# Patient Record
Sex: Male | Born: 1990 | Race: White | Hispanic: No | Marital: Single | Smoking: Former smoker
Health system: Southern US, Community
[De-identification: ages and names within clinical notes are randomized; demographics above are authoritative.]

## PROBLEM LIST (undated history)

## (undated) DIAGNOSIS — E119 Type 2 diabetes mellitus without complications: Secondary | ICD-10-CM

## (undated) DIAGNOSIS — K219 Gastro-esophageal reflux disease without esophagitis: Secondary | ICD-10-CM

## (undated) DIAGNOSIS — R569 Unspecified convulsions: Secondary | ICD-10-CM

---
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
## 2009-06-02 ENCOUNTER — Emergency Department: Payer: Self-pay | Admitting: Emergency Medicine

## 2009-06-10 ENCOUNTER — Emergency Department: Payer: Self-pay | Admitting: Emergency Medicine

## 2009-06-20 ENCOUNTER — Emergency Department: Payer: Self-pay | Admitting: Emergency Medicine

## 2009-08-04 ENCOUNTER — Inpatient Hospital Stay: Payer: Self-pay | Admitting: Internal Medicine

## 2011-11-13 ENCOUNTER — Ambulatory Visit: Payer: Self-pay

## 2011-11-30 ENCOUNTER — Ambulatory Visit: Payer: Self-pay | Admitting: Internal Medicine

## 2013-02-25 ENCOUNTER — Emergency Department: Payer: Self-pay | Admitting: Emergency Medicine

## 2013-02-25 LAB — LIPASE, BLOOD: Lipase: 68 U/L — ABNORMAL LOW (ref 73–393)

## 2013-02-25 LAB — COMPREHENSIVE METABOLIC PANEL
Albumin: 4.7 g/dL (ref 3.4–5.0)
BUN: 22 mg/dL — ABNORMAL HIGH (ref 7–18)
Bilirubin,Total: 0.7 mg/dL (ref 0.2–1.0)
Chloride: 110 mmol/L — ABNORMAL HIGH (ref 98–107)
Creatinine: 0.9 mg/dL (ref 0.60–1.30)
EGFR (African American): >60
EGFR (Non-African Amer.): >60
Glucose: 130 mg/dL — ABNORMAL HIGH (ref 65–99)
Potassium: 4.1 mmol/L (ref 3.5–5.1)
Sodium: 143 mmol/L (ref 136–145)
Total Protein: 8.2 g/dL (ref 6.4–8.2)

## 2013-02-25 LAB — CBC WITH DIFFERENTIAL/PLATELET
Basophil #: 0 10*3/uL (ref 0.0–0.1)
Basophil %: 0.3 %
Eosinophil #: 0.1 10*3/uL (ref 0.0–0.7)
Eosinophil %: 0.5 %
HCT: 49.4 % (ref 40.0–52.0)
HGB: 16.4 g/dL (ref 13.0–18.0)
Lymphocyte %: 9.5 %
MCHC: 33.1 g/dL (ref 32.0–36.0)
MCV: 82 fL (ref 80–100)
Monocyte #: 0.5 x10 3/mm (ref 0.2–1.0)
Monocyte %: 3.4 %
Neutrophil #: 13.2 10*3/uL — ABNORMAL HIGH (ref 1.4–6.5)
Neutrophil %: 86.3 %
RDW: 14.7 % — ABNORMAL HIGH (ref 11.5–14.5)
WBC: 15.3 10*3/uL — ABNORMAL HIGH (ref 3.8–10.6)

## 2013-02-25 LAB — URINALYSIS, COMPLETE
Bilirubin,UR: NEGATIVE
Blood: NEGATIVE
Glucose,UR: 150 mg/dL (ref 0–75)
Leukocyte Esterase: NEGATIVE
RBC,UR: <1 /HPF (ref 0–5)
Squamous Epithelial: NONE SEEN
WBC UR: 1 /HPF (ref 0–5)

## 2014-11-10 ENCOUNTER — Emergency Department: Payer: Self-pay | Admitting: Emergency Medicine

## 2014-11-10 LAB — URINALYSIS, COMPLETE
BILIRUBIN, UR: NEGATIVE
Bacteria: NONE SEEN
Glucose,UR: NEGATIVE mg/dL (ref 0–75)
Hyaline Cast: 13
Ketone: NEGATIVE
LEUKOCYTE ESTERASE: NEGATIVE
Nitrite: NEGATIVE
PH: 5 (ref 4.5–8.0)
Protein: NEGATIVE
RBC,UR: NONE SEEN /HPF (ref 0–5)
SQUAMOUS EPITHELIAL: NONE SEEN
Specific Gravity: 1.013 (ref 1.003–1.030)
WBC UR: NONE SEEN /HPF (ref 0–5)

## 2014-11-10 LAB — DRUG SCREEN, URINE
AMPHETAMINES, UR SCREEN: NEGATIVE (ref ?–1000)
BENZODIAZEPINE, UR SCRN: NEGATIVE (ref ?–200)
Barbiturates, Ur Screen: NEGATIVE (ref ?–200)
Cannabinoid 50 Ng, Ur ~~LOC~~: NEGATIVE (ref ?–50)
Cocaine Metabolite,Ur ~~LOC~~: NEGATIVE (ref ?–300)
MDMA (Ecstasy)Ur Screen: NEGATIVE (ref ?–500)
Methadone, Ur Screen: NEGATIVE (ref ?–300)
Opiate, Ur Screen: POSITIVE (ref ?–300)
Phencyclidine (PCP) Ur S: NEGATIVE (ref ?–25)
Tricyclic, Ur Screen: NEGATIVE (ref ?–1000)

## 2014-11-10 LAB — COMPREHENSIVE METABOLIC PANEL
ALBUMIN: 4.1 g/dL (ref 3.4–5.0)
ANION GAP: 12 (ref 7–16)
Alkaline Phosphatase: 104 U/L
BUN: 18 mg/dL (ref 7–18)
Bilirubin,Total: 0.4 mg/dL (ref 0.2–1.0)
CHLORIDE: 103 mmol/L (ref 98–107)
CO2: 23 mmol/L (ref 21–32)
CREATININE: 1.14 mg/dL (ref 0.60–1.30)
Calcium, Total: 8.4 mg/dL — ABNORMAL LOW (ref 8.5–10.1)
EGFR (Non-African Amer.): >60
Glucose: 92 mg/dL (ref 65–99)
OSMOLALITY: 277 (ref 275–301)
Potassium: 3.8 mmol/L (ref 3.5–5.1)
SGOT(AST): 22 U/L (ref 15–37)
SGPT (ALT): 34 U/L
Sodium: 138 mmol/L (ref 136–145)
TOTAL PROTEIN: 7.5 g/dL (ref 6.4–8.2)

## 2014-11-10 LAB — CBC
HCT: 48.4 % (ref 40.0–52.0)
HGB: 15.6 g/dL (ref 13.0–18.0)
MCH: 27.4 pg (ref 26.0–34.0)
MCHC: 32.3 g/dL (ref 32.0–36.0)
MCV: 85 fL (ref 80–100)
Platelet: 186 10*3/uL (ref 150–440)
RBC: 5.69 10*6/uL (ref 4.40–5.90)
RDW: 14.1 % (ref 11.5–14.5)
WBC: 13.2 10*3/uL — ABNORMAL HIGH (ref 3.8–10.6)

## 2014-11-10 LAB — ETHANOL

## 2019-07-20 ENCOUNTER — Emergency Department: Payer: BC Managed Care – PPO

## 2019-07-20 ENCOUNTER — Encounter: Payer: Self-pay | Admitting: *Deleted

## 2019-07-20 ENCOUNTER — Emergency Department
Admission: EM | Admit: 2019-07-20 | Discharge: 2019-07-20 | Disposition: A | Discharge status: Other Acute Inpatient Hospital | Payer: BC Managed Care – PPO | Attending: Emergency Medicine | Admitting: Emergency Medicine

## 2019-07-20 ENCOUNTER — Other Ambulatory Visit: Payer: Self-pay

## 2019-07-20 DIAGNOSIS — S06321A Contusion and laceration of left cerebrum with loss of consciousness of 30 minutes or less, initial encounter: Secondary | ICD-10-CM

## 2019-07-20 DIAGNOSIS — R569 Unspecified convulsions: Secondary | ICD-10-CM | POA: Insufficient documentation

## 2019-07-20 DIAGNOSIS — F0781 Postconcussional syndrome: Secondary | ICD-10-CM

## 2019-07-20 DIAGNOSIS — E11649 Type 2 diabetes mellitus with hypoglycemia without coma: Secondary | ICD-10-CM | POA: Diagnosis not present

## 2019-07-20 DIAGNOSIS — W19XXXA Unspecified fall, initial encounter: Secondary | ICD-10-CM | POA: Diagnosis not present

## 2019-07-20 DIAGNOSIS — Y999 Unspecified external cause status: Secondary | ICD-10-CM | POA: Diagnosis not present

## 2019-07-20 DIAGNOSIS — S098XXA Other specified injuries of head, initial encounter: Secondary | ICD-10-CM | POA: Insufficient documentation

## 2019-07-20 DIAGNOSIS — Y9389 Activity, other specified: Secondary | ICD-10-CM | POA: Insufficient documentation

## 2019-07-20 DIAGNOSIS — S0990XA Unspecified injury of head, initial encounter: Secondary | ICD-10-CM | POA: Diagnosis present

## 2019-07-20 DIAGNOSIS — S065X9A Traumatic subdural hemorrhage with loss of consciousness of unspecified duration, initial encounter: Secondary | ICD-10-CM

## 2019-07-20 DIAGNOSIS — Y9289 Other specified places as the place of occurrence of the external cause: Secondary | ICD-10-CM | POA: Diagnosis not present

## 2019-07-20 DIAGNOSIS — E162 Hypoglycemia, unspecified: Secondary | ICD-10-CM

## 2019-07-20 DIAGNOSIS — S060X1A Concussion with loss of consciousness of 30 minutes or less, initial encounter: Secondary | ICD-10-CM

## 2019-07-20 DIAGNOSIS — S060X0A Concussion without loss of consciousness, initial encounter: Secondary | ICD-10-CM | POA: Diagnosis not present

## 2019-07-20 DIAGNOSIS — S065XAA Traumatic subdural hemorrhage with loss of consciousness status unknown, initial encounter: Secondary | ICD-10-CM

## 2019-07-20 DIAGNOSIS — S06351A Traumatic hemorrhage of left cerebrum with loss of consciousness of 30 minutes or less, initial encounter: Secondary | ICD-10-CM

## 2019-07-20 HISTORY — DX: Type 2 diabetes mellitus without complications: E11.9

## 2019-07-20 HISTORY — DX: Unspecified convulsions: R56.9

## 2019-07-20 LAB — CBC
HCT: 45.8 % (ref 39.0–52.0)
Hemoglobin: 15.2 g/dL (ref 13.0–17.0)
MCH: 28 pg (ref 26.0–34.0)
MCHC: 33.2 g/dL (ref 30.0–36.0)
MCV: 84.3 fL (ref 80.0–100.0)
Platelets: 213 10*3/uL (ref 150–400)
RBC: 5.43 MIL/uL (ref 4.22–5.81)
RDW: 13.9 % (ref 11.5–15.5)
WBC: 9.8 10*3/uL (ref 4.0–10.5)
nRBC: 0 % (ref 0.0–0.2)

## 2019-07-20 LAB — GLUCOSE, CAPILLARY
Glucose-Capillary: 92 mg/dL (ref 70–99)
Glucose-Capillary: 70 mg/dL (ref 70–99)
Glucose-Capillary: 83 mg/dL (ref 70–99)
Glucose-Capillary: 238 mg/dL — ABNORMAL HIGH (ref 70–99)

## 2019-07-20 LAB — COMPREHENSIVE METABOLIC PANEL
ALT: 19 U/L (ref 0–44)
AST: 32 U/L (ref 15–41)
Albumin: 4.6 g/dL (ref 3.5–5.0)
Alkaline Phosphatase: 82 U/L (ref 38–126)
Anion gap: 14 (ref 5–15)
BUN: 19 mg/dL (ref 6–20)
CO2: 19 mmol/L — ABNORMAL LOW (ref 22–32)
Calcium: 8.7 mg/dL — ABNORMAL LOW (ref 8.9–10.3)
Chloride: 108 mmol/L (ref 98–111)
Creatinine, Ser: 1.02 mg/dL (ref 0.61–1.24)
GFR calc Af Amer: >60 mL/min (ref >60)
GFR calc non Af Amer: >60 mL/min (ref >60)
Glucose, Bld: 104 mg/dL — ABNORMAL HIGH (ref 70–99)
Potassium: 3.3 mmol/L — ABNORMAL LOW (ref 3.5–5.1)
Sodium: 141 mmol/L (ref 135–145)
Total Bilirubin: 0.8 mg/dL (ref 0.3–1.2)
Total Protein: 7.4 g/dL (ref 6.5–8.1)

## 2019-07-20 MED ORDER — SODIUM CHLORIDE 0.9% FLUSH
3.0000 mL | Freq: Once | INTRAVENOUS | Status: AC
  Administered 2019-07-20: 3 mL via INTRAVENOUS

## 2019-07-20 MED ORDER — KETOROLAC TROMETHAMINE 30 MG/ML IJ SOLN
30.0000 mg | Freq: Once | INTRAMUSCULAR | Status: AC
  Administered 2019-07-20: 30 mg via INTRAVENOUS
  Filled 2019-07-20: qty 1

## 2019-07-20 MED ORDER — ONDANSETRON HCL 4 MG/2ML IJ SOLN
4.0000 mg | Freq: Once | INTRAMUSCULAR | Status: AC
  Administered 2019-07-20: 4 mg via INTRAVENOUS
  Filled 2019-07-20: qty 2

## 2019-07-20 MED ORDER — DEXTROSE IN LACTATED RINGERS 5 % IV SOLN
INTRAVENOUS | Status: DC
  Administered 2019-07-20: 13:00:00 via INTRAVENOUS

## 2019-07-20 MED ORDER — ONDANSETRON 4 MG PO TBDP: 4.0000 mg | ORAL_TABLET | Freq: Three times a day (TID) | ORAL | 0 refills | Status: DC | PRN

## 2019-07-20 MED ORDER — METOCLOPRAMIDE HCL 5 MG/ML IJ SOLN
10.0000 mg | Freq: Once | INTRAMUSCULAR | Status: AC
  Administered 2019-07-20: 10 mg via INTRAVENOUS
  Filled 2019-07-20: qty 2

## 2019-07-20 MED ORDER — ACETAMINOPHEN 500 MG PO TABS
1000.0000 mg | ORAL_TABLET | Freq: Once | ORAL | Status: AC
  Administered 2019-07-20: 11:00:00 1000 mg via ORAL
  Filled 2019-07-20: qty 2

## 2019-07-20 MED ORDER — MORPHINE SULFATE (PF) 4 MG/ML IV SOLN
4.0000 mg | Freq: Once | INTRAVENOUS | Status: AC
  Administered 2019-07-20: 4 mg via INTRAVENOUS
  Filled 2019-07-20: qty 1

## 2019-07-20 MED ORDER — LEVETIRACETAM IN NACL 1000 MG/100ML IV SOLN
1000.0000 mg | Freq: Once | INTRAVENOUS | Status: AC
  Administered 2019-07-20: 18:00:00 1000 mg via INTRAVENOUS
  Filled 2019-07-20: qty 100

## 2019-07-20 MED ORDER — FENTANYL CITRATE (PF) 100 MCG/2ML IJ SOLN: 100.0000 ug | INTRAMUSCULAR | Status: DC | PRN

## 2019-07-20 MED ORDER — GADOBUTROL 1 MMOL/ML IV SOLN
7.0000 mL | Freq: Once | INTRAVENOUS | Status: AC | PRN
  Administered 2019-07-20: 17:00:00 7 mL via INTRAVENOUS

## 2019-07-20 NOTE — ED Notes (Signed)
Pt still in mri 

## 2019-07-20 NOTE — ED Provider Notes (Signed)
[facility] Emergency Department Provider Note       Time seen: ----------------------------------------- 10:31 AM on 07/20/2019 -----------------------------------------   I have reviewed the triage vital signs and the nursing notes.  HISTORY   Chief Complaint Seizures and Head Injury    HPI Lucas Shaw is a 28 y.o. male with a history of diabetes and seizures who presents to the ED for a fall that occurred at work.  Patient fell back and hit his head.  Staff noted seizure-like activity but not sure if this came before or after the fall.  Blood sugar was 43 and then was elevated to 103 on arrival to the ER.  Patient states he has not eaten this morning.  Zofran was given by EMS.  Past Medical History:  Diagnosis Date  . Diabetes mellitus without complication (HCC)   . Seizures (HCC)     There are no active problems to display for this patient.   History reviewed. No pertinent surgical history.  Allergies Patient has no known allergies.  Social History Social History   Tobacco Use  . Smoking status: Never Smoker  . Smokeless tobacco: Never Used  Substance Use Topics  . Alcohol use: Yes    Comment: occasionally  . Drug use: Never    Review of Systems Constitutional: Negative for fever. Cardiovascular: Negative for chest pain. Respiratory: Negative for shortness of breath. Gastrointestinal: Negative for abdominal pain, positive for vomiting Musculoskeletal: Negative for back pain. Skin: Negative for rash. Neurological: Positive for headache, possible seizure-like activity  All systems negative/normal/unremarkable except as stated in the HPI  ____________________________________________   PHYSICAL EXAM:  VITAL SIGNS: ED Triage Vitals  Enc Vitals Group     BP --      Pulse Rate 07/20/19 1004 (!) 103     Resp 07/20/19 1004 12     Temp 07/20/19 1004 98.4 F (36.9 C)     Temp Source 07/20/19 1004 Oral     SpO2 07/20/19  1004 98 %     Weight 07/20/19 1005 170 lb (77.1 kg)     Height 07/20/19 1005 5\' 8"  (1.727 m)     Head Circumference --      Peak Flow --      Pain Score 07/20/19 1005 10     Pain Loc --      Pain Edu? --      Excl. in GC? --     Constitutional: Alert and oriented. Well appearing and in no distress. Eyes: Conjunctivae are normal. Normal extraocular movements. ENT      Head: Normocephalic, severe left parieto-occipital scalp tenderness      Nose: No congestion/rhinnorhea.      Mouth/Throat: Mucous membranes are moist.      Neck: No stridor. Cardiovascular: Normal rate, regular rhythm. No murmurs, rubs, or gallops. Respiratory: Normal respiratory effort without tachypnea nor retractions. Breath sounds are clear and equal bilaterally. No wheezes/rales/rhonchi. Gastrointestinal: Soft and nontender. Normal bowel sounds Musculoskeletal: Nontender with normal range of motion in extremities. No lower extremity tenderness nor edema. Neurologic:  Normal speech and language. No gross focal neurologic deficits are appreciated.  Skin:  Skin is warm, dry and intact. No rash noted. Psychiatric: Mood and affect are normal. Speech and behavior are normal.  ____________________________________________  ED COURSE:  As part of my medical decision making, I reviewed the following data within the electronic MEDICAL RECORD NUMBER History obtained from family if available, nursing notes, old chart and ekg, as well as  notes from prior ED visits. Patient presented for a fall with head injury likely due to hypoglycemia, we will assess with labs and imaging as indicated at this time.   Procedures  [name] was evaluated in Emergency Department on 07/20/2019 for the symptoms described in the history of present illness. He was evaluated in the context of the global COVID-19 pandemic, which necessitated consideration that the patient might be at risk for infection with the SARS-CoV-2 virus that causes COVID-19.  Institutional protocols and algorithms that pertain to the evaluation of patients at risk for COVID-19 are in a state of rapid change based on information released by regulatory bodies including the CDC and federal and state organizations. These policies and algorithms were followed during the patient's care in the ED.  ____________________________________________   LABS (pertinent positives/negatives)  Labs Reviewed  COMPREHENSIVE METABOLIC PANEL - Abnormal; Notable for the following components:      Result Value   Potassium 3.3 (*)    CO2 19 (*)    Glucose, Bld 104 (*)    Calcium 8.7 (*)    All other components within normal limits  GLUCOSE, CAPILLARY - Abnormal; Notable for the following components:   Glucose-Capillary 238 (*)    All other components within normal limits  CBC  GLUCOSE, CAPILLARY  GLUCOSE, CAPILLARY  GLUCOSE, CAPILLARY  CBG MONITORING, ED    RADIOLOGY Images were viewed by me  CT head  IMPRESSION:  1. Small area of hyperattenuation in the inferior aspect of the  right frontal lobe, overlying the right frontal sinus. This may  represent an artifact, as this part of the brain is prone to beam  hardening artifacts, however in the settings of acute trauma, and  given the asymmetric appearance when compared to the left frontal  lobe, small intraparenchymal hemorrhage cannot be excluded.  2. Otherwise no evidence of acute traumatic injury to the head.  ____________________________________________   DIFFERENTIAL DIAGNOSIS   Hypoglycemia, arrhythmia, seizure, dehydration, electrolyte abnormality, subdural, subarachnoid hemorrhage  FINAL ASSESSMENT AND PLAN  Fall, minor head injury, hypoglycemia, possible seizure, concussion   Plan: The patient had presented for a fall that occurred at work likely from hypoglycemia. Patient's labs were overall reassuring although he did have some borderline hypoglycemia for which she was given D5 LR. Patient's imaging revealed  a small area of hyperattenuation which was reviewed by both neurosurgery and neurology and felt to be artifactual.  He has had a bad headache and had some nausea vomiting here which I think is secondary to concussion.  Due to his persistent headache and vomiting we have consulted neurology who recommends MRI.  If MRI is negative he may go home.   Laurence Aly, MD    Note: This note was generated in part or whole with voice recognition software. Voice recognition is usually quite accurate but there are transcription errors that can and very often do occur. I apologize for any typographical errors that were not detected and corrected.     Earleen Newport, MD 07/20/19 [phone]

## 2019-07-20 NOTE — Progress Notes (Signed)
Neurology:  84 y/or male with hx of DM on insulin s/p fall today and seizure like activity in setting of hypoglycemia. Complaining of headache, N/V. No focality on examination.     Neurological Examination   Mental Status: Alert, oriented, thought content appropriate.  Speech fluent without evidence of aphasia.  Able to follow 3 step commands without difficulty. Cranial Nerves: III,IV, VI: ptosis not present, extra-ocular motions intact bilaterally V,VII: smile symmetric, facial light touch sensation normal bilaterally VIII: hearing normal bilaterally IX,X: gag reflex present XI: bilateral shoulder shrug XII: midline tongue extension Motor: Right : Upper extremity   5/5    Left:     Upper extremity   5/5  Lower extremity   5/5     Lower extremity   5/5 Tone and bulk:normal tone throughout; no atrophy noted Sensory: Pinprick and light touch intact throughout, bilaterally Deep Tendon Reflexes: 2+ and symmetric throughout Plantars: Right: downgoing   Left: downgoing Cerebellar: normal finger-to-nose, normal rapid alternating movements and normal heel-to-shin test Gait: not tested    - CTH don't think there is any hemorrhage but likely artifact - Pt is with post concussive symptoms s/p fall - Agree with MRI of brain while in ED to look for any acute process and if negative can be d/c home with supportive therapy - Supportive post concussion therapy includes: hydration, limit screen time, limit bright lights, rest for up to 7-10 days.

## 2019-07-20 NOTE — ED Notes (Signed)
EMTALA reviewed. 

## 2019-07-20 NOTE — ED Notes (Signed)
Pt attempted to sit up and started vomiting immediately. MD made aware.

## 2019-07-20 NOTE — ED Notes (Signed)
Immediately after taking tylenol pt had another episode of vomiting. MD made aware.

## 2019-07-20 NOTE — ED Notes (Signed)
Transfer consent signed by pt  Ems in with pt now.  Family at bedside.

## 2019-07-20 NOTE — ED Notes (Signed)
Resumed care from shannon rn. Pt alert.  Iv fluids infusing.  meds given.  Pt alert   Speech clear.   Family with pt. nsr on monitor.

## 2019-07-20 NOTE — ED Provider Notes (Signed)
Patient received in sign-out from Dr. Jimmye Norman.  Workup and evaluation pending MRI.  MRI does show evidence of closed head injury.  Will give dose of IV Keppra.  Is not on any anticoagulation.  Blood pressure stable.  Patient with previous care at [facility].  Have consulted with the transfer center.  Patient stabilized and appropriate for transfer to tertiary facility.   .Critical Care Performed by: Merlyn Lot, MD Authorized by: Merlyn Lot, MD   Critical care provider statement:    Critical care time (minutes):  30   Critical care time was exclusive of:  Separately billable procedures and treating other patients   Critical care was necessary to treat or prevent imminent or life-threatening deterioration of the following conditions:  Trauma   Critical care was time spent personally by me on the following activities:  Development of treatment plan with patient or surrogate, discussions with consultants, evaluation of patient's response to treatment, examination of patient, obtaining history from patient or surrogate, ordering and performing treatments and interventions, ordering and review of laboratory studies, ordering and review of radiographic studies, pulse oximetry, re-evaluation of patient's condition and review of old charts       Merlyn Lot, MD 07/20/19 1749

## 2019-07-20 NOTE — ED Notes (Signed)
c-collar placed on pt prior to transfer.

## 2019-07-20 NOTE — ED Triage Notes (Signed)
Unsteady gate at work, pt fell back and hit his head. Staff noted a seizure but is unsure if the seizure came befor ethe fall or after. CBG was 43 and elevated to 103 with EMS in route to hospital. Hx of seizures when pt was younger but not medications takenm for seizure.   4mg  Zofran given with EMS.

## 2019-07-20 NOTE — ED Notes (Signed)
Patient transported to MRI 

## 2019-07-20 NOTE — ED Notes (Signed)
Neurologist at bedside to assess patient. Pts mother at bedside as well.

## 2019-07-20 NOTE — ED Notes (Signed)
Patient transported to CT 

## 2019-07-21 DIAGNOSIS — I619 Nontraumatic intracerebral hemorrhage, unspecified: Secondary | ICD-10-CM | POA: Insufficient documentation

## 2020-06-07 ENCOUNTER — Emergency Department: Payer: BC Managed Care – PPO

## 2020-06-07 ENCOUNTER — Other Ambulatory Visit: Payer: Self-pay

## 2020-06-07 ENCOUNTER — Emergency Department
Admission: EM | Admit: 2020-06-07 | Discharge: 2020-06-07 | Disposition: A | Discharge status: Home / Self Care | Payer: BC Managed Care – PPO | Attending: Emergency Medicine | Admitting: Emergency Medicine

## 2020-06-07 ENCOUNTER — Encounter: Payer: Self-pay | Admitting: Emergency Medicine

## 2020-06-07 DIAGNOSIS — E109 Type 1 diabetes mellitus without complications: Secondary | ICD-10-CM | POA: Diagnosis not present

## 2020-06-07 DIAGNOSIS — F458 Other somatoform disorders: Secondary | ICD-10-CM | POA: Insufficient documentation

## 2020-06-07 DIAGNOSIS — Z794 Long term (current) use of insulin: Secondary | ICD-10-CM | POA: Diagnosis not present

## 2020-06-07 DIAGNOSIS — R0989 Other specified symptoms and signs involving the circulatory and respiratory systems: Secondary | ICD-10-CM

## 2020-06-07 MED ORDER — NITROGLYCERIN 0.4 MG SL SUBL
0.4000 mg | SUBLINGUAL_TABLET | Freq: Once | SUBLINGUAL | Status: AC
  Administered 2020-06-07: 0.4 mg via SUBLINGUAL
  Filled 2020-06-07: qty 1

## 2020-06-07 MED ORDER — ACETAMINOPHEN 500 MG PO TABS: 1000.0000 mg | ORAL_TABLET | Freq: Once | ORAL | Status: DC

## 2020-06-07 MED ORDER — LIDOCAINE VISCOUS HCL 2 % MT SOLN
15.0000 mL | Freq: Once | OROMUCOSAL | Status: AC
  Administered 2020-06-07: 15 mL via OROMUCOSAL
  Filled 2020-06-07: qty 15

## 2020-06-07 MED ORDER — ACETAMINOPHEN 160 MG/5ML PO SOLN
1000.0000 mg | Freq: Once | ORAL | Status: AC
  Administered 2020-06-07: 1000 mg via ORAL
  Filled 2020-06-07: qty 40.6

## 2020-06-07 NOTE — Discharge Instructions (Signed)
Please follow up with GI if symptoms become recurrent.   Return to the ER for symptoms that change or worsen if unable to see PCP or the GI specialist.

## 2020-06-07 NOTE — ED Notes (Signed)
See triage note   States he feels like something is stuck in his throat  States he felt like his sugar was low  He drank a [employer] and ate some peanut butter    States he is not sure if some of the peanut butter became stuck  Swallowing well

## 2020-06-07 NOTE — ED Triage Notes (Signed)
Pt states that it hurts when he talk or swallows

## 2020-06-07 NOTE — ED Triage Notes (Signed)
Pt reports woke up this am and drank some Mt Dew and ate some peanut butter because he is a diabetic and now he feels like something is stuck in his throat. Pt able to speak in full sentences.

## 2020-06-07 NOTE — ED Provider Notes (Signed)
[facility] Emergency Department Provider Note  ____________________________________________  Time seen: Approximately 9:22 AM  I have reviewed the triage vital signs and the nursing notes.   HISTORY  Chief Complaint Foreign Body and Emesis    HPI Lucas Shaw is a 29 y.o. male  With a history of type 1 diabetes and seizures who presents to the emergency department for  treatment and evaluation of esophageal foreign body sensation.  Patient states that around 630 this morning his blood sugar was low when he got up and ate some creamy peanut butter and Mt. Dew. He states that he feels that it is still stuck in his esophagus and is having trouble swallowing. He has had similar incidences in the past, but have not lasted this long.   Past Medical History:  Diagnosis Date  . Diabetes mellitus without complication (HCC)   . Seizures (HCC)     There are no problems to display for this patient.   History reviewed. No pertinent surgical history.  Prior to Admission medications   Medication Sig Start Date End Date Taking? Authorizing Provider  fluticasone (FLONASE) 50 MCG/ACT nasal spray Place 1-2 sprays into both nostrils daily. 02/21/19   [provider]  HUMALOG KWIKPEN 100 UNIT/ML KwikPen Inject 5 Units into the skin 3 (three) times daily before meals. 02/25/19   [provider]  insulin aspart (NOVOLOG FLEXPEN) 100 UNIT/ML FlexPen Inject 5 Units into the skin 3 (three) times daily before meals. 02/19/17   [provider]  LANTUS SOLOSTAR 100 UNIT/ML Solostar Pen Inject 25 Units into the skin at bedtime. 06/11/19   [provider]    Allergies Patient has no known allergies.  No family history on file.  Social History Social History   Tobacco Use  . Smoking status: Never Smoker  . Smokeless tobacco: Never Used  Substance Use Topics  . Alcohol use: Yes    Comment: occasionally  . Drug use: Never    Review of  Systems Constitutional: Negative for fever. Eyes: No visual changes. ENT: Positive for foreign body sensation in throat; positive for difficulty swallowing. Respiratory: Denies shortness of breath. Gastrointestinal: Negative for abdominal pain.  No nausea, no vomiting.  No diarrhea.  Negative for frequent heartburn or GERD Genitourinary: Negative for dysuria.  Negative for decrease in need to void. Musculoskeletal: Negative for generalized body aches. Skin: Negative for rash. Neurological: Negative for headaches, negative for focal weakness or numbness.  ____________________________________________   PHYSICAL EXAM:  VITAL SIGNS: ED Triage Vitals  Enc Vitals Group     BP 06/07/20 0812 (!) 163/97     Pulse Rate 06/07/20 0812 94     Resp 06/07/20 0812 22     Temp 06/07/20 0812 98.8 F (37.1 C)     Temp Source 06/07/20 0812 Oral     SpO2 06/07/20 0812 99 %     Weight 06/07/20 0813 169 lb 12.1 oz (77 kg)     Height 06/07/20 0805 5\' 8"  (1.727 m)     Head Circumference --      Peak Flow --      Pain Score 06/07/20 0805 7     Pain Loc --      Pain Edu? --      Excl. in GC? --     Constitutional: Alert and oriented. Well appearing and in no acute distress. Eyes: Conjunctivae are normal.  Head: Atraumatic. Nose: No congestion/rhinnorhea. Mouth/Throat: Mucous membranes are moist.  Oropharynx clear, tonsils 1+  without exudate. Uvula is midline. Ears: Exam deferred. Neck: No stridor. Voice clear Lymphatic: Anterior cervical nodes non-palpable. Cardiovascular: Normal rate, regular rhythm. Good peripheral circulation. Respiratory: Normal respiratory effort. Lungs CTAB. Gastrointestinal: Soft and nontender. Musculoskeletal: FROM of neck, upper and lower extremities. Neurologic:  Normal speech and language. No gross focal neurologic deficits are appreciated. Skin:  Skin is warm, dry and intact. No rash noted Psychiatric: Mood and affect are normal. Speech and behavior are  normal.  ____________________________________________   LABS (all labs ordered are listed, but only abnormal results are displayed)  Labs Reviewed - No data to display ____________________________________________  EKG  Not indiated ____________________________________________  RADIOLOGY  Not indicated. ____________________________________________   PROCEDURES  Procedure(s) performed: None  Critical Care performed: No ____________________________________________   INITIAL IMPRESSION / ASSESSMENT AND PLAN / ED COURSE  29 year old male presents to the emergency department for treatment and evaluation of foreign body sensation. See HPI for further details. Awaiting x-ray result.  X-ray is negative for radiopaque foreign body.  Plan will be to give him some viscous lidocaine and see if that takes away the globus sensation.  Not much improvement after the viscous lidocaine.  He is able to swallow water and maintain secretions but does not feel that he will be able to tolerate any solid foods.  Plan will be to try nitroglycerin.  Significant improvement after sublingual nitroglycerin.  He was also given some Tylenol to help prevent headache.  Patient feels that he is ready for discharge.  He states that he still has a slight foreign body sensation but that it is getting better.  He was advised that he needs to take small bites and chew his food well.  If symptoms become recurrent he is to see GI.  Pertinent labs & imaging results that were available during my care of the patient were reviewed by me and considered in my medical decision making (see chart for details). ____________________________________________  New Prescriptions   No medications on file    FINAL CLINICAL IMPRESSION(S) / ED DIAGNOSES  Final diagnoses:  Globus pharyngeus    If controlled substance prescribed during this visit, 12 month history viewed on the [facility] prior to issuing an initial prescription  for Schedule II or III opiod.   Note:  This document was prepared using Dragon voice recognition software and may include unintentional dictation errors.   Chinita Pester, FNP 06/07/20 1019    Emily Filbert, MD 06/07/20 1051

## 2020-06-10 ENCOUNTER — Telehealth: Payer: Self-pay

## 2020-06-10 MED ORDER — OMEPRAZOLE 40 MG PO CPDR
40.0000 mg | DELAYED_RELEASE_CAPSULE | Freq: Two times a day (BID) | ORAL | 2 refills | Status: DC
Start: 2020-06-10 — End: 2020-09-26

## 2020-06-10 NOTE — Telephone Encounter (Signed)
Called and patient would like medicine sent to [facility]. Sent medication to pharmacy. He made appointment for 08/07/2020

## 2020-06-10 NOTE — Telephone Encounter (Signed)
-----   Message from Toney Reil, MD sent at 06/07/2020  2:28 PM EDT ----- Regarding: Re: dysphagia Please call pt and send in a prescription for prilosec 40mg  BID until seen by Tell him we received referral from ER  RV

## 2020-06-19 NOTE — H&P (Signed)
NAME: Lucas Shaw, Lucas Shaw MEDICAL RECORD CZ:[id number] ACCOUNT [id number] DATE OF BIRTH:06-23-1991 FACILITY: [facility] LOCATION:  PHYSICIAN:Preet Gilles Chiquito, MD  HISTORY AND PHYSICAL  DATE OF ADMISSION:  06/27/2020  CHIEF COMPLAINT:  Right groin swelling and pain.  HISTORY OF PRESENT ILLNESS:  The patient is a 29 year old Caucasian male who has had intermittent right inguinal and scrotal swelling associated with discomfort since 06/28/2020.  More recently, he has had discomfort and increased swelling, particularly  with lifting and straining.  He does perform a lot of lifting at work.  He was found to have a right inguinal hernia on exam and comes in for right inguinal herniorrhaphy.  ALLERGIES:  No drug allergies.  CURRENT MEDICATIONS:  Insulin.  SURGICAL HISTORY:  Negative.  PAST AND CURRENT MEDICAL CONDITIONS:   1.  Concussion due to head trauma 2020 without permanent sequelae. 2.  Insulin-dependent diabetes since 2001.  REVIEW OF SYSTEMS:  The patient denies chest pain, shortness of breath, heart disease, lung disease, hypertension or stroke.  SOCIAL HISTORY:  The patient quit smoking in 2013 with a 5-pack year history.  He consumes alcohol rarely.  FAMILY HISTORY:  Father died at age 69 of a drug overdose.  Mother is living, age 10 without health problems.  He is single and has no children.  There is no family history of urologic disease.  PHYSICAL EXAMINATION: VITAL SIGNS:  Height 5 feet 7, weight 171, BP 130/70, BMI 27. GENERAL:  Well-nourished, white male in no acute distress. HEENT:  Sclerae were clear.  Pupils were equally round and reactive to light and accommodation.  Extraocular movements were intact. NECK:  No palpable masses or tenderness.  No audible carotid bruits.   LYMPHATIC:  No palpable inguinal or cervical adenopathy  hoarseness. PULMONARY:  Clear to auscultation. HEART:  Regular rhythm and rate without audible murmurs. ABDOMEN:  Soft, nontender  abdomen. GENITOURINARY:  Circumcised.  Testes were smooth, nontender, approximately 20 mL in size each.  He had an easily reducible right inguinal hernia. RECTAL:  Deferred:   NEUROLOGIC:  Alert and oriented x3.  IMPRESSION:  Symptomatic right inguinal hernia.  PLAN:  Right inguinal herniorrhaphy.  PN/NUANCE  D:06/18/2020 T:06/18/2020 JOB:[id number]

## 2020-06-20 ENCOUNTER — Encounter
Admission: RE | Admit: 2020-06-20 | Discharge: 2020-06-20 | Disposition: A | Discharge status: Home / Self Care | Payer: BC Managed Care – PPO | Source: Ambulatory Visit | Attending: Urology | Admitting: Urology

## 2020-06-20 ENCOUNTER — Other Ambulatory Visit: Payer: Self-pay

## 2020-06-20 DIAGNOSIS — Z01812 Encounter for preprocedural laboratory examination: Secondary | ICD-10-CM | POA: Diagnosis present

## 2020-06-20 DIAGNOSIS — E118 Type 2 diabetes mellitus with unspecified complications: Secondary | ICD-10-CM | POA: Diagnosis not present

## 2020-06-20 HISTORY — DX: Gastro-esophageal reflux disease without esophagitis: K21.9

## 2020-06-20 NOTE — Patient Instructions (Signed)
Your procedure is scheduled on: 06-27-20 THURSDAY Report to Same Day Surgery 2nd floor medical mall [facility] Entrance-take elevator on left to 2nd floor.  Check in with surgery information desk.) To find out your arrival time please call [phone] between 1PM - 3PM on 06-26-20 [facility]  Remember: Instructions that are not followed completely may result in serious medical risk, up to and including death, or upon the discretion of your surgeon and anesthesiologist your surgery may need to be rescheduled.    _x___ 1. Do not eat food after midnight the night before your procedure. NO GUM OR CANDY AFTER MIDNIGHT. You may drink WATER up to 2 hours before you are scheduled to arrive at the hospital for your procedure.  Do not drink WATER within 2 hours of your scheduled arrival to the hospital.  Type 1 and type 2 diabetics should only drink water.    __x__ 2. No Alcohol for 24 hours before or after surgery.   __x__3. No Smoking or e-cigarettes for 24 prior to surgery.  Do not use any chewable tobacco products for at least 6 hour prior to surgery   ____  4. Bring all medications with you on the day of surgery if instructed.    __x__ 5. Notify your doctor if there is any change in your medical condition     (cold, fever, infections).    x___6. On the morning of surgery brush your teeth with toothpaste and water.  You may rinse your mouth with mouth wash if you wish.  Do not swallow any toothpaste or mouthwash.   Do not wear jewelry, make-up, hairpins, clips or nail polish.  Do not wear lotions, powders, or perfumes. You may wear deodorant.  Do not shave 48 hours prior to surgery. Men may shave face and neck.  Do not bring valuables to the hospital.    [facility] is not responsible for any belongings or valuables.               Contacts, dentures or bridgework may not be worn into surgery.  Leave your suitcase in the car. After surgery it may be brought to your room.  For patients  admitted to the hospital, discharge time is determined by your treatment team.  _  Patients discharged the day of surgery will not be allowed to drive home.  You will need someone to drive you home and stay with you the night of your procedure.    Please read over the following fact sheets that you were given:   [facility] Preparing for Surgery   _x___ TAKE THE FOLLOWING MEDICATION THE MORNING OF SURGERY WITH A SMALL SIP OF WATER. These include:  1. PRILOSEC (OMEPRAZOLE)  2.  3.  4.  5.  6.  ____Fleets enema or Magnesium Citrate as directed.   _X___ Use CHG Soap as directed on instruction sheet   ____ Use inhalers on the day of surgery and bring to hospital day of surgery  ____ Stop Metformin and Janumet 2 days prior to surgery.    _X___ Take 1/2 of usual insulin dose the night before surgery and none on the morning surgery-TAKE HALF OF YOUR LANTUS WEDNESDAY NIGHT AND NO INSULIN THE MORNING OF YOUR SURGERY  ____ Follow recommendations from Cardiologist, Pulmonologist or PCP regarding stopping Aspirin, Coumadin, Plavix ,Eliquis, Effient, or Pradaxa, and Pletal.  X____Stop Anti-inflammatories such as Advil, Aleve, Ibuprofen, Motrin, Naproxen, Naprosyn, Goodies powders or aspirin products NOW-OK to take Tylenol    ____  Stop supplements until after surgery.   ____ Bring C-Pap to the hospital.

## 2020-06-20 NOTE — Pre-Procedure Instructions (Signed)
Patient Instructions - documented in this encounter Patient Instructions Toni Arthurs, MD - 03/08/2020 2:15 PM EDT  Formatting of this note might be different from the original. A1c today was 7.0 which is the best it has been in several years at least.  Let's keep your regimen the same.  Call [facility] when you leave here and schedule an appt with them the same day as your next appt with me. Their number is: [phone]  Electronically signed by Toni Arthurs, MD at 03/08/2020 3:07 PM EDT    Progress Notes - documented in this encounter Toni Arthurs, MD - 03/08/2020 2:15 PM EDT Formatting of this note is different from the original. Images from the original note were not included. HPI: Lucas Shaw is seen today for follow-up type I diabetes. He is a 29 y.o. male who was diagnosed with diabetes in 2002 at the age of 84. He has recently become more concerned about his diabetes after a severe hypoglycemic event in 9/20 leading to a seizure with head trauma. I last saw him in 1/21  He takes 19 units of Lantus Monday-Friday and 22-23 units on the weekends at bedtime. He takes NovoLog with his meals. He has been instructed in carbohydrate counting in the past, but mostly now he takes around 5 units with his meals. He sometimes takes more or less depending on what his blood sugar is and the size of his meal.  He uses the freestyle libre2 for continuous glucose monitoring. The average in the last 2 weeks was 150. His numbers look good for the most part. Review of his diet shows that he avoids concentrated carbohydrates. He does not do much exercising. He has lost 5 pounds since last visit. He is concerned about his diabetes.  ROS: No chest pain. No shortness of breath.   Medical History: Past Medical History:  Diagnosis Date  . Seizures (CMS-HCC)  . Type 1 diabetes, diagnosed in 2002   Surgical History: No past surgical history on  file.  Social History: reports that he quit smoking about 8 years ago. His smoking use included cigarettes. He has a 2.00 pack-year smoking history. He has never used smokeless tobacco. He reports current alcohol use. He reports that he does not use drugs. He works in Psychiatric nurse.   Family History: family history includes Heart disease in his maternal grandfather.  Medications: Current Outpatient Medications  Medication Sig Dispense Refill Last Dose  . acetaminophen (TYLENOL) 500 MG tablet Take 1,000 mg by mouth every 8 (eight) hours as needed for Pain Taking  . blood glucose diagnostic test strip Use to test 3-4 times daily. Freestyle lite 150 each 12 Taking  . FREESTYLE LIBRE 2 READER reader Use to monitor blood glucose. 1 each 1 Taking  . FREESTYLE LIBRE 2 SENSOR kit Use 1 kit every 14 (fourteen) days for glucose monitoring 2 kit 6 Taking  . lancets Use to test 3-4 times daily. Freestyle lite 150 each 12 Taking  . LANTUS SOLOSTAR pen injector (concentration 100 units/mL) INJECT 24 UNITS SUBCUTANEOUSLY NIGHTLY. 10 Syringe 3 Taking  . NOVOLOG FLEXPEN U-100 INSULIN pen injector (concentration 100 units/mL) INJECT 5 UNITS BEFORE MEALS 3 TIMES A DAY AND USE SS 1UNIT CORRECTION PER 50MG GLUCOSE >200 15 Syringe 0 Taking  . pen needle, diabetic 31 gauge x 3/16" needle Use to inject insulin 4 times daily as directed. e10.9 150 each 11 Taking  . gabapentin (NEURONTIN) 300 MG capsule Take 1 capsule (300 mg  total) by mouth 3 (three) times daily as needed (pain) for up to 15 days (Patient not taking: Reported on 09/04/2019 ) 45 capsule 0  . levETIRAcetam (KEPPRA) 1000 MG tablet Take 1 tablet (1,000 mg total) by mouth every 12 (twelve) hours (Patient not taking: Reported on 12/08/2019 ) 60 tablet 0 Not Taking  . niFEdipine (PROCARDIA) 10 MG capsule Take 3 capsules (30 mg total) by mouth every 8 (eight) hours for 30 days (Patient not taking: Reported on 09/04/2019 ) 270 capsule 0  . ondansetron  (ZOFRAN) 4 MG tablet Take 1 tablet (4 mg total) by mouth every 8 (eight) hours as needed for Nausea (Patient not taking: Reported on 12/08/2019 ) 30 tablet 0 Not Taking   Allergies: No Known Allergies  Physical Exam: Vitals:  03/08/20 1431  BP: (!) 140/98  Pulse: 97  SpO2: 98%  Weight: 75.9 kg (167 lb 6.4 oz)  Height: 172.7 cm (_0 )   Body mass index is 25.45 kg/m. GENERAL: Pleasant, well-appearing male in no distress.   Physical exam otherwise deferred due to coronavirus precautions   Labs: 09/18/2009: A1c = 8.8  07/23/2019: A1c = 7.8 08/16/2019: A1c = 7.9. K/Cr/Ca = 4.4/1.07/9.5. Cholesterol = 125/147/41/59. LFTs normal. TSH = 1.43. Total T4 = 7.2. Vitamin D = 27.9  12/08/2019: A1c = 7.2  03/08/2020: A1c = 7.0  Assessment/Plan: 1. Type I diabetes. His A1c today is 7.0. Based on review of his Elenor Legato, I will make no changes to his regimen. I encouraged continued lifestyle modifications. We briefly discussed pumps again last visit and he still does not seem interested right now. I will plan to try and convince him to try one of the automated pumps over time.  2. Hypoglycemia. He had a severe hypoglycemic event resulting in a seizure and cerebral bleed in 9/20. Hopefully the continuous glucose monitoring system will prevent him from getting severe hypoglycemia again.  3. Cerebral bleed/seizure. He has followed up with neurology and symptoms are improving. He was previously on Keppra. He did have a seizure years ago from hypoglycemia as well.  4. Prophylaxis. He has not seen the eye doctor in several years. Last visit, I sent a referral to [facility] and asked him to schedule an appointment. They called him but he never responded. Today, I gave him the phone number and asked him to schedule an appointment the same day he sees me next time so he does not have to take more time off work for the appointment. I did a foot exam in 1/21.  5. Vitamin D Deficiency. His vitamin D was  mildly low in 9/20 on no supplement. He is not really taking any now so I encourage supplementation. Will plan to recheck again at a future visit.  6. He will return to clinic in 3 months.  Labs rec'd from PCP [facility]). 04/10/20: A1c=7.8. K/Cr/Ca= 4.6/1.12/9.4. Chol=130/60/57/60. LFTs nl. TSH=2.6. FTI=1.6 (1.2-4.9). D=47.  This note is partially prepared by Alger Memos, Scribe, in the presence of and acting as the scribe of Dr. Mee Hives , MD.  [facility], MD    Electronically signed by Toni Arthurs, MD at 04/27/2020 5:41 PM EDT  Plan of Treatment - documented as of this encounter Upcoming Encounters Upcoming Encounters  Date Type Specialty Care Team Description  07/12/2020 Office Visit Endocrinology Toni Arthurs, MD  [address]  [place], [place] [address]  [phone]  [phone] (Fax)    Procedures - documented in this encounter Procedure  Name Priority Date/Time Associated Diagnosis Comments  POC-A1C- [facility] [place] STAT 03/08/2020 2:45 PM EDT Type 1 diabetes mellitus with hypoglycemia (CMS-HCC)  Results for this procedure are in the results section.   Lab Results - documented in this encounter  P[facility]-A[facility]C-[facility] (03/08/2020 2:45 PM EDT) [facility] (03/08/2020 2:45 PM EDT)  Component Value Ref Range Performed At Pathologist Signature  Hemoglobin A1C 7.0 (H) 4.2 - 5.6 % [facility] - LAB   Average Blood Glucose (Calc) 154 mg/dL [facility] - LAB    P[facility]C-[facility] (03/08/2020 2:45 PM EDT)  Specimen  Blood   POC-A1C-[facility] (03/08/2020 2:45 PM EDT)  Narrative Performed At  Normal Range:  4.2 - 5.6%  Increased Risk: 5.7 - 6.4%  Diabetes:    >= 6.5%  Glycemic Control for adults with diabetes: <7%   [place] (03/08/2020 2:45 PM EDT)   Performing Organization Address City/State/ZIP Code Phone Number  [place]  [address]  [place], [place] [address]      Visit Diagnoses - documented in this encounter Diagnosis  Type 1 diabetes mellitus with hypoglycemia (CMS-HCC) - Primary   Vitamin D deficiency   Images Patient Demographics  Patient Address Communication Language Race / Ethnicity Marital Status  [address] [place], [place] [address] [phone] (Mobile) [email or web address] English (Preferred) White / Not Hispanic or Latino Single  Patient Contacts  Contact Name Contact Address Communication Relationship to Patient  Liberty Handy Unknown [phone] [facility]) Parent, Guardian  Document Information  Primary Care Provider Other Service Providers Document Coverage Dates  Provider (Jan. 11, 2016January 11, 2016 - Present)     Apr. 23, 2021April 23, 2021 - Jun. 12, 2021June 12, 2021   [place] [address] [place], [place] [address]   Encounter Providers Encounter Date  Toni Arthurs, MD (Attending) [phone] (Work) [phone] (Fax) [address] [place], [place] [address] Endocrinology Apr. 23, 2021April 23, 2021    Show All Sections

## 2020-06-24 ENCOUNTER — Other Ambulatory Visit: Payer: Self-pay

## 2020-06-24 ENCOUNTER — Encounter
Admission: RE | Admit: 2020-06-24 | Discharge: 2020-06-24 | Disposition: A | Discharge status: Home / Self Care | Payer: BC Managed Care – PPO | Source: Ambulatory Visit | Attending: Urology | Admitting: Urology

## 2020-06-24 DIAGNOSIS — Z01810 Encounter for preprocedural cardiovascular examination: Secondary | ICD-10-CM | POA: Diagnosis not present

## 2020-06-24 DIAGNOSIS — Z01818 Encounter for other preprocedural examination: Secondary | ICD-10-CM | POA: Diagnosis not present

## 2020-06-24 LAB — BASIC METABOLIC PANEL
Anion gap: 10 (ref 5–15)
BUN: 19 mg/dL (ref 6–20)
CO2: 25 mmol/L (ref 22–32)
Calcium: 8.7 mg/dL — ABNORMAL LOW (ref 8.9–10.3)
Chloride: 103 mmol/L (ref 98–111)
Creatinine, Ser: 1.1 mg/dL (ref 0.61–1.24)
GFR calc Af Amer: >60 mL/min (ref >60)
GFR calc non Af Amer: >60 mL/min (ref >60)
Glucose, Bld: 274 mg/dL — ABNORMAL HIGH (ref 70–99)
Potassium: 3.9 mmol/L (ref 3.5–5.1)
Sodium: 138 mmol/L (ref 135–145)

## 2020-06-25 ENCOUNTER — Other Ambulatory Visit
Admission: RE | Admit: 2020-06-25 | Discharge: 2020-06-25 | Disposition: A | Discharge status: Home / Self Care | Payer: BC Managed Care – PPO | Source: Ambulatory Visit | Attending: Urology | Admitting: Urology

## 2020-06-25 DIAGNOSIS — Z20822 Contact with and (suspected) exposure to covid-19: Secondary | ICD-10-CM | POA: Diagnosis not present

## 2020-06-25 DIAGNOSIS — Z01812 Encounter for preprocedural laboratory examination: Secondary | ICD-10-CM | POA: Diagnosis not present

## 2020-06-25 LAB — SARS CORONAVIRUS 2 (TAT 6-24 HRS): SARS Coronavirus 2: NEGATIVE

## 2020-06-27 ENCOUNTER — Encounter: Payer: Self-pay | Admitting: Urology

## 2020-06-27 ENCOUNTER — Ambulatory Visit: Payer: BC Managed Care – PPO | Admitting: Certified Registered"

## 2020-06-27 ENCOUNTER — Ambulatory Visit
Admission: RE | Admit: 2020-06-27 | Discharge: 2020-06-27 | Disposition: A | Discharge status: Home / Self Care | Payer: BC Managed Care – PPO | Attending: Urology | Admitting: Urology

## 2020-06-27 ENCOUNTER — Encounter
Admission: RE | Disposition: A | Discharge status: Home / Self Care | Payer: Self-pay | Source: Home / Self Care | Attending: Urology

## 2020-06-27 DIAGNOSIS — K219 Gastro-esophageal reflux disease without esophagitis: Secondary | ICD-10-CM | POA: Diagnosis not present

## 2020-06-27 DIAGNOSIS — Z794 Long term (current) use of insulin: Secondary | ICD-10-CM | POA: Diagnosis not present

## 2020-06-27 DIAGNOSIS — E119 Type 2 diabetes mellitus without complications: Secondary | ICD-10-CM | POA: Diagnosis not present

## 2020-06-27 DIAGNOSIS — R569 Unspecified convulsions: Secondary | ICD-10-CM | POA: Insufficient documentation

## 2020-06-27 DIAGNOSIS — Z87891 Personal history of nicotine dependence: Secondary | ICD-10-CM | POA: Diagnosis not present

## 2020-06-27 DIAGNOSIS — K4090 Unilateral inguinal hernia, without obstruction or gangrene, not specified as recurrent: Secondary | ICD-10-CM | POA: Insufficient documentation

## 2020-06-27 HISTORY — PX: INGUINAL HERNIA REPAIR: SHX194

## 2020-06-27 HISTORY — PX: INSERTION OF MESH: SHX5868

## 2020-06-27 LAB — GLUCOSE, CAPILLARY
Glucose-Capillary: 190 mg/dL — ABNORMAL HIGH (ref 70–99)
Glucose-Capillary: 237 mg/dL — ABNORMAL HIGH (ref 70–99)

## 2020-06-27 SURGERY — REPAIR, HERNIA, INGUINAL, ADULT
Anesthesia: General | Site: Groin | Laterality: Right

## 2020-06-27 MED ORDER — SODIUM CHLORIDE 0.9 % IV SOLN: INTRAVENOUS | Status: DC

## 2020-06-27 MED ORDER — LIDOCAINE HCL (PF) 2 % IJ SOLN
INTRAMUSCULAR | Status: AC
  Filled 2020-06-27: qty 15

## 2020-06-27 MED ORDER — FENTANYL CITRATE (PF) 100 MCG/2ML IJ SOLN
INTRAMUSCULAR | Status: AC
  Filled 2020-06-27: qty 2

## 2020-06-27 MED ORDER — PHENYLEPHRINE HCL (PRESSORS) 10 MG/ML IV SOLN
INTRAVENOUS | Status: AC
  Filled 2020-06-27: qty 1

## 2020-06-27 MED ORDER — DEXAMETHASONE SODIUM PHOSPHATE 10 MG/ML IJ SOLN
INTRAMUSCULAR | Status: AC
  Filled 2020-06-27: qty 1

## 2020-06-27 MED ORDER — PHENYLEPHRINE HCL (PRESSORS) 10 MG/ML IV SOLN
INTRAVENOUS | Status: DC | PRN
  Administered 2020-06-27 (×2): 200 ug via INTRAVENOUS

## 2020-06-27 MED ORDER — EPHEDRINE 5 MG/ML INJ
INTRAVENOUS | Status: AC
  Filled 2020-06-27: qty 10

## 2020-06-27 MED ORDER — ROCURONIUM BROMIDE 10 MG/ML (PF) SYRINGE
PREFILLED_SYRINGE | INTRAVENOUS | Status: AC
  Filled 2020-06-27: qty 10

## 2020-06-27 MED ORDER — SUCCINYLCHOLINE CHLORIDE 200 MG/10ML IV SOSY
PREFILLED_SYRINGE | INTRAVENOUS | Status: AC
  Filled 2020-06-27: qty 10

## 2020-06-27 MED ORDER — ESMOLOL HCL 100 MG/10ML IV SOLN
INTRAVENOUS | Status: DC | PRN
  Administered 2020-06-27: 20 mg via INTRAVENOUS

## 2020-06-27 MED ORDER — SUGAMMADEX SODIUM 500 MG/5ML IV SOLN
INTRAVENOUS | Status: DC | PRN
  Administered 2020-06-27: 154.2 mg via INTRAVENOUS

## 2020-06-27 MED ORDER — ESMOLOL HCL 100 MG/10ML IV SOLN
INTRAVENOUS | Status: AC
  Filled 2020-06-27: qty 10

## 2020-06-27 MED ORDER — DEXMEDETOMIDINE HCL IN NACL 200 MCG/50ML IV SOLN
INTRAVENOUS | Status: DC | PRN
Start: 2020-06-27 — End: 2020-06-27
  Administered 2020-06-27: 12 ug via INTRAVENOUS
  Administered 2020-06-27: 16 ug via INTRAVENOUS

## 2020-06-27 MED ORDER — SEVOFLURANE IN SOLN
RESPIRATORY_TRACT | Status: AC
  Filled 2020-06-27: qty 250

## 2020-06-27 MED ORDER — ACETAMINOPHEN 10 MG/ML IV SOLN
INTRAVENOUS | Status: AC
  Filled 2020-06-27: qty 100

## 2020-06-27 MED ORDER — SUCCINYLCHOLINE CHLORIDE 20 MG/ML IJ SOLN
INTRAMUSCULAR | Status: DC | PRN
  Administered 2020-06-27: 140 mg via INTRAVENOUS

## 2020-06-27 MED ORDER — KETOROLAC TROMETHAMINE 30 MG/ML IJ SOLN
INTRAMUSCULAR | Status: AC
  Filled 2020-06-27: qty 1

## 2020-06-27 MED ORDER — ROCURONIUM BROMIDE 100 MG/10ML IV SOLN
INTRAVENOUS | Status: DC | PRN
  Administered 2020-06-27: 20 mg via INTRAVENOUS
  Administered 2020-06-27: 30 mg via INTRAVENOUS

## 2020-06-27 MED ORDER — CHLORHEXIDINE GLUCONATE 0.12 % MT SOLN: 15.0000 mL | Freq: Once | OROMUCOSAL | Status: AC

## 2020-06-27 MED ORDER — ONDANSETRON HCL 4 MG/2ML IJ SOLN
INTRAMUSCULAR | Status: AC
  Filled 2020-06-27: qty 4

## 2020-06-27 MED ORDER — HYDROCODONE-ACETAMINOPHEN 10-325 MG PO TABS: 1.0000 | ORAL_TABLET | ORAL | 0 refills | Status: DC | PRN

## 2020-06-27 MED ORDER — DOCUSATE SODIUM 100 MG PO CAPS
200.0000 mg | ORAL_CAPSULE | Freq: Two times a day (BID) | ORAL | 3 refills | Status: DC
Start: 2020-06-27 — End: 2020-10-28

## 2020-06-27 MED ORDER — MIDAZOLAM HCL 2 MG/2ML IJ SOLN
INTRAMUSCULAR | Status: DC | PRN
  Administered 2020-06-27: 2 mg via INTRAVENOUS

## 2020-06-27 MED ORDER — BUPIVACAINE HCL (PF) 0.5 % IJ SOLN
INTRAMUSCULAR | Status: AC
  Filled 2020-06-27: qty 30

## 2020-06-27 MED ORDER — MIDAZOLAM HCL 2 MG/2ML IJ SOLN
INTRAMUSCULAR | Status: AC
  Filled 2020-06-27: qty 2

## 2020-06-27 MED ORDER — EPHEDRINE SULFATE 50 MG/ML IJ SOLN
INTRAMUSCULAR | Status: DC | PRN
  Administered 2020-06-27: 5 mg via INTRAVENOUS

## 2020-06-27 MED ORDER — DEXMEDETOMIDINE HCL IN NACL 80 MCG/20ML IV SOLN
INTRAVENOUS | Status: AC
  Filled 2020-06-27: qty 20

## 2020-06-27 MED ORDER — OXYCODONE HCL 5 MG/5ML PO SOLN: 5.0000 mg | Freq: Once | ORAL | Status: DC | PRN

## 2020-06-27 MED ORDER — NEOMYCIN-POLYMYXIN B GU 40-200000 IR SOLN
Status: DC | PRN
  Administered 2020-06-27: 2 mL

## 2020-06-27 MED ORDER — OXYCODONE HCL 5 MG PO TABS: 5.0000 mg | ORAL_TABLET | Freq: Once | ORAL | Status: DC | PRN

## 2020-06-27 MED ORDER — GLYCOPYRROLATE 0.2 MG/ML IJ SOLN
INTRAMUSCULAR | Status: AC
  Filled 2020-06-27: qty 1

## 2020-06-27 MED ORDER — ORAL CARE MOUTH RINSE: 15.0000 mL | Freq: Once | OROMUCOSAL | Status: AC

## 2020-06-27 MED ORDER — FENTANYL CITRATE (PF) 100 MCG/2ML IJ SOLN
25.0000 ug | INTRAMUSCULAR | Status: DC | PRN
  Administered 2020-06-27: 25 ug via INTRAVENOUS

## 2020-06-27 MED ORDER — CEFAZOLIN SODIUM-DEXTROSE 1-4 GM/50ML-% IV SOLN
INTRAVENOUS | Status: AC
  Filled 2020-06-27: qty 50

## 2020-06-27 MED ORDER — GLYCOPYRROLATE 0.2 MG/ML IJ SOLN
INTRAMUSCULAR | Status: DC | PRN
  Administered 2020-06-27: .2 mg via INTRAVENOUS

## 2020-06-27 MED ORDER — ACETAMINOPHEN 10 MG/ML IV SOLN
INTRAVENOUS | Status: DC | PRN
  Administered 2020-06-27: 1000 mg via INTRAVENOUS

## 2020-06-27 MED ORDER — CEPHALEXIN 500 MG PO CAPS: 500.0000 mg | ORAL_CAPSULE | Freq: Four times a day (QID) | ORAL | 0 refills | Status: DC

## 2020-06-27 MED ORDER — DEXAMETHASONE SODIUM PHOSPHATE 10 MG/ML IJ SOLN
INTRAMUSCULAR | Status: DC | PRN
  Administered 2020-06-27: 10 mg via INTRAVENOUS

## 2020-06-27 MED ORDER — ONDANSETRON HCL 4 MG/2ML IJ SOLN
INTRAMUSCULAR | Status: DC | PRN
  Administered 2020-06-27 (×2): 4 mg via INTRAVENOUS

## 2020-06-27 MED ORDER — LIDOCAINE-EPINEPHRINE (PF) 1 %-1:200000 IJ SOLN
INTRAMUSCULAR | Status: AC
  Filled 2020-06-27: qty 30

## 2020-06-27 MED ORDER — NEOMYCIN-POLYMYXIN B GU 40-200000 IR SOLN
Status: AC
  Filled 2020-06-27: qty 20

## 2020-06-27 MED ORDER — FENTANYL CITRATE (PF) 100 MCG/2ML IJ SOLN
INTRAMUSCULAR | Status: AC
  Administered 2020-06-27: 25 ug via INTRAVENOUS
  Filled 2020-06-27: qty 2

## 2020-06-27 MED ORDER — CHLORHEXIDINE GLUCONATE 0.12 % MT SOLN
OROMUCOSAL | Status: AC
  Administered 2020-06-27: 15 mL via OROMUCOSAL
  Filled 2020-06-27: qty 15

## 2020-06-27 MED ORDER — HYDROMORPHONE HCL 1 MG/ML IJ SOLN
INTRAMUSCULAR | Status: AC
  Filled 2020-06-27: qty 1

## 2020-06-27 MED ORDER — HYDROMORPHONE HCL 1 MG/ML IJ SOLN
INTRAMUSCULAR | Status: DC | PRN
  Administered 2020-06-27 (×2): .5 mg via INTRAVENOUS

## 2020-06-27 MED ORDER — KETOROLAC TROMETHAMINE 30 MG/ML IJ SOLN
INTRAMUSCULAR | Status: DC | PRN
  Administered 2020-06-27: 30 mg via INTRAVENOUS

## 2020-06-27 MED ORDER — LIDOCAINE HCL (CARDIAC) PF 100 MG/5ML IV SOSY
PREFILLED_SYRINGE | INTRAVENOUS | Status: DC | PRN
  Administered 2020-06-27: 100 mg via INTRAVENOUS

## 2020-06-27 MED ORDER — PROPOFOL 10 MG/ML IV BOLUS
INTRAVENOUS | Status: DC | PRN
  Administered 2020-06-27: 50 mg via INTRAVENOUS
  Administered 2020-06-27: 150 mg via INTRAVENOUS

## 2020-06-27 MED ORDER — LIDOCAINE-EPINEPHRINE (PF) 1 %-1:200000 IJ SOLN
INTRAMUSCULAR | Status: DC | PRN
  Administered 2020-06-27: 25 mL

## 2020-06-27 MED ORDER — FENTANYL CITRATE (PF) 100 MCG/2ML IJ SOLN
INTRAMUSCULAR | Status: DC | PRN
  Administered 2020-06-27 (×2): 50 ug via INTRAVENOUS

## 2020-06-27 MED ORDER — CEFAZOLIN SODIUM-DEXTROSE 1-4 GM/50ML-% IV SOLN
INTRAVENOUS | Status: DC | PRN
Start: 2020-06-27 — End: 2020-06-27
  Administered 2020-06-27: 1 g via INTRAVENOUS

## 2020-06-27 SURGICAL SUPPLY — 42 items
BLADE SURG 15 STRL LF DISP TIS (BLADE) ×1 IMPLANT
BLADE SURG 15 STRL SS (BLADE) ×2
CANISTER SUCT 1200ML W/VALVE (MISCELLANEOUS) ×3 IMPLANT
CLOSURE WOUND 1/2 X4 (GAUZE/BANDAGES/DRESSINGS) ×1
COVER WAND RF STERILE (DRAPES) ×3 IMPLANT
DERMABOND ADVANCED (GAUZE/BANDAGES/DRESSINGS) ×2
DERMABOND ADVANCED .7 DNX12 (GAUZE/BANDAGES/DRESSINGS) ×1 IMPLANT
DRAIN PENROSE 1/4X12 LTX STRL (WOUND CARE) ×3 IMPLANT
DRAIN PENROSE 5/8X18 LTX STRL (DRAIN) ×2 IMPLANT
DRAPE LAPAROTOMY 100X77 ABD (DRAPES) ×3 IMPLANT
DRSG GAUZE FLUFF 36X18 (GAUZE/BANDAGES/DRESSINGS) ×3 IMPLANT
DRSG TEGADERM 4X4.75 (GAUZE/BANDAGES/DRESSINGS) ×3 IMPLANT
DRSG TELFA 4X3 1S NADH ST (GAUZE/BANDAGES/DRESSINGS) ×3 IMPLANT
DURAPREP 26ML APPLICATOR (WOUND CARE) ×3 IMPLANT
ELECT REM PT RETURN 9FT ADLT (ELECTROSURGICAL) ×3
ELECTRODE REM PT RTRN 9FT ADLT (ELECTROSURGICAL) ×1 IMPLANT
GLOVE BIO SURGEON STRL SZ7.5 (GLOVE) ×5 IMPLANT
GOWN STRL REUS W/ TWL LRG LVL3 (GOWN DISPOSABLE) ×1 IMPLANT
GOWN STRL REUS W/ TWL XL LVL3 (GOWN DISPOSABLE) ×1 IMPLANT
GOWN STRL REUS W/TWL LRG LVL3 (GOWN DISPOSABLE) ×2
GOWN STRL REUS W/TWL XL LVL3 (GOWN DISPOSABLE) ×2
KIT TURNOVER KIT A (KITS) ×3 IMPLANT
LABEL OR SOLS (LABEL) ×1 IMPLANT
MESH HERNIA 4.5X10 SYS PRO LRG (Mesh General) IMPLANT
MESH HERNIA SYS PROLENE LG (Mesh General) ×2 IMPLANT
NDL HYPO 25X1 1.5 SAFETY (NEEDLE) ×1 IMPLANT
NEEDLE HYPO 25X1 1.5 SAFETY (NEEDLE) ×3 IMPLANT
NS IRRIG 500ML POUR BTL (IV SOLUTION) ×3 IMPLANT
PACK BASIN MINOR (MISCELLANEOUS) ×3 IMPLANT
SPONGE KITTNER 5P (MISCELLANEOUS) ×3 IMPLANT
STRIP CLOSURE SKIN 1/2X4 (GAUZE/BANDAGES/DRESSINGS) ×2 IMPLANT
SUPPORT SCROTAL LG STRP (MISCELLANEOUS) ×2 IMPLANT
SUPPORTER ATHLETIC LG (MISCELLANEOUS) ×1
SUT CHROMIC 3-0 (SUTURE) ×2
SUT CHROMIC 3-0 54XMFL REEL CR (SUTURE) ×1
SUT PLAIN 3 0 SH 27IN (SUTURE) ×7 IMPLANT
SUT SURGILON 0 BLK (SUTURE) IMPLANT
SUT VIC AB 4-0 PS2 18 (SUTURE) ×3 IMPLANT
SUTURE CHRMC 3-0 54XMFL REL CR (SUTURE) ×1 IMPLANT
SWABSTK COMLB BENZOIN TINCTURE (MISCELLANEOUS) ×3 IMPLANT
SYR 10ML LL (SYRINGE) ×3 IMPLANT
SYR BULB IRRIG 60ML STRL (SYRINGE) ×3 IMPLANT

## 2020-06-27 NOTE — Anesthesia Postprocedure Evaluation (Signed)
Anesthesia Post Note  Patient: RIGO LETTS  Procedure(s) Performed: RIGHT INGUINAL HERNIA REPAIR (Right Groin) INSERTION OF MESH (Right Groin)  Patient location during evaluation: PACU Anesthesia Type: General Level of consciousness: awake and alert Pain management: pain level controlled Vital Signs Assessment: post-procedure vital signs reviewed and stable Respiratory status: spontaneous breathing, nonlabored ventilation, respiratory function stable and patient connected to nasal cannula oxygen Cardiovascular status: blood pressure returned to baseline and stable Postop Assessment: no apparent nausea or vomiting Anesthetic complications: no   No complications documented.   Last Vitals:  Vitals:   06/27/20 1008 06/27/20 1034  BP: 109/64 117/75  Pulse: 86 84  Resp: 18 18  Temp: 36.6 C (!) 36.3 C  SpO2: 97% 100%    Last Pain:  Vitals:   06/27/20 1034  TempSrc: Tympanic  PainSc: 1                  Cleda Mccreedy Marcianne Ozbun

## 2020-06-27 NOTE — Discharge Instructions (Addendum)
AMBULATORY SURGERY  DISCHARGE INSTRUCTIONS   1) The drugs that you were given will stay in your system until tomorrow so for the next 24 hours you should not:  A) Drive an automobile B) Make any legal decisions C) Drink any alcoholic beverage   2) You may resume regular meals tomorrow.  Today it is better to start with liquids and gradually work up to solid foods.  You may eat anything you prefer, but it is better to start with liquids, then soup and crackers, and gradually work up to solid foods.   3) Please notify your doctor immediately if you have any unusual bleeding, trouble breathing, redness and pain at the surgery site, drainage, fever, or pain not relieved by medication.    4) Additional Instructions:        Please contact your physician with any problems or Same Day Surgery at [phone], Monday through Friday 6 am to 4 pm, or [place] at [facility] number at [phone].Hernia, Adult     A hernia happens when tissue inside your body pushes out through a weak spot in your belly muscles (abdominal wall). This makes a round lump (bulge). The lump may be:  In a scar from surgery that was done in your belly (incisional hernia).  Near your belly button (umbilical hernia).  In your groin (inguinal hernia). Your groin is the area where your leg meets your lower belly (abdomen). This kind of hernia could also be: ? In your scrotum, if you are male. ? In folds of skin around your vagina, if you are male.  In your upper thigh (femoral hernia).  Inside your belly (hiatal hernia). This happens when your stomach slides above the muscle between your belly and your chest (diaphragm). If your hernia is small and it does not cause pain, you may not need treatment. If your hernia is large or it causes pain, you may need surgery. Follow these instructions at home: Activity  Avoid stretching or overusing (straining) the muscles near your hernia. Straining can happen  when you: ? Lift something heavy. ? Poop (have a bowel movement).  Do not lift anything that is heavier than 10 lb (4.5 kg), or the limit that you are told, until your doctor says that it is safe.  Use the strength of your legs when you lift something heavy. Do not use only your back muscles to lift. General instructions  Do these things if told by your doctor so you do not have trouble pooping (constipation): ? Drink enough fluid to keep your pee (urine) pale yellow. ? Eat foods that are high in fiber. These include fresh fruits and vegetables, whole grains, and beans. ? Limit foods that are high in fat and processed sugars. These include foods that are fried or sweet. ? Take medicine for trouble pooping.  When you cough, try to cough gently.  You may try to push your hernia in by very gently pressing on it when you are lying down. Do not try to force the bulge back in if it will not push in easily.  If you are overweight, work with your doctor to lose weight safely.  Do not use any products that have nicotine or tobacco in them. These include cigarettes and e-cigarettes. If you need help quitting, ask your doctor.  If you will be having surgery (hernia repair), watch your hernia for changes in shape, size, or color. Tell your doctor if you see any changes.  Take over-the-counter and prescription  medicines only as told by your doctor.  Keep all follow-up visits as told by your doctor. Contact a doctor if:  You get new pain, swelling, or redness near your hernia.  You poop fewer times in a week than normal.  You have trouble pooping.  You have poop (stool) that is more dry than normal.  You have poop that is harder or larger than normal. Get help right away if:  You have a fever.  You have belly pain that gets worse.  You feel sick to your stomach (nauseous).  You throw up (vomit).  Your hernia cannot be pushed in by very gently pressing on it when you are lying down.  Do not try to force the bulge back in if it will not push in easily.  Your hernia: ? Changes in shape or size. ? Changes color. ? Feels hard or it hurts when you touch it. These symptoms may represent a serious problem that is an emergency. Do not wait to see if the symptoms will go away. Get medical help right away. Call your local emergency services (911 in the [place] Summary  A hernia happens when tissue inside your body pushes out through a weak spot in the belly muscles. This creates a bulge.  If your hernia is small and it does not hurt, you may not need treatment. If your hernia is large or it hurts, you may need surgery.  If you will be having surgery, watch your hernia for changes in shape, size, or color. Tell your doctor about any changes. This information is not intended to replace advice given to you by your health care provider. Make sure you discuss any questions you have with your health care provider. Document Revised: 02/23/2019 Document Reviewed: 08/04/2017 Elsevier Patient Education  2020 Elsevier Inc.   Open Hernia Repair, Adult, Care After This sheet gives you information about how to care for yourself after your procedure. Your health care provider may also give you more specific instructions. If you have problems or questions, contact your health care provider. What can I expect after the procedure? After the procedure, it is common to have:  Mild discomfort.  Slight bruising.  Minor swelling.  Pain in the abdomen. Follow these instructions at home: Incision care   Follow instructions from your health care provider about how to take care of your incision area. Make sure you: ? Wash your hands with soap and water before you change your bandage (dressing). If soap and water are not available, use hand sanitizer. ? Change your dressing as told by your health care provider. ? Leave stitches (sutures), skin glue, or adhesive strips in place. These skin  closures may need to stay in place for 2 weeks or longer. If adhesive strip edges start to loosen and curl up, you may trim the loose edges. Do not remove adhesive strips completely unless your health care provider tells you to do that.  Check your incision area every day for signs of infection. Check for: ? More redness, swelling, or pain. ? More fluid or blood. ? Warmth. ? Pus or a bad smell. Activity  Do not drive or use heavy machinery while taking prescription pain medicine. Do not drive until your health care provider approves.  Until your health care provider approves: ? Do not lift anything that is heavier than 10 lb (4.5 kg). ? Do not play contact sports.  Return to your normal activities as told by your health care provider. Ask your  health care provider what activities are safe. General instructions  To prevent or treat constipation while you are taking prescription pain medicine, your health care provider may recommend that you: ? Drink enough fluid to keep your urine clear or pale yellow. ? Take over-the-counter or prescription medicines. ? Eat foods that are high in fiber, such as fresh fruits and vegetables, whole grains, and beans. ? Limit foods that are high in fat and processed sugars, such as fried and sweet foods.  Take over-the-counter and prescription medicines only as told by your health care provider.  Do not take tub baths or go swimming until your health care provider approves.  Keep all follow-up visits as told by your health care provider. This is important. Contact a health care provider if:  You develop a rash.  You have more redness, swelling, or pain around your incision.  You have more fluid or blood coming from your incision.  Your incision feels warm to the touch.  You have pus or a bad smell coming from your incision.  You have a fever or chills.  You have blood in your stool (feces).  You have not had a bowel movement in 2-3  days.  Your pain is not controlled with medicine. Get help right away if:  You have chest pain or shortness of breath.  You feel light-headed or feel faint.  You have severe pain.  You vomit and your pain is worse. This information is not intended to replace advice given to you by your health care provider. Make sure you discuss any questions you have with your health care provider. Document Revised: 10/15/2017 Document Reviewed: 04/15/2016 Elsevier Patient Education  2020 [employer].

## 2020-06-27 NOTE — H&P (Signed)
Date of Initial H&P: 06/18/20  History reviewed, patient examined, no change in status, stable for surgery. 

## 2020-06-27 NOTE — Anesthesia Preprocedure Evaluation (Signed)
Anesthesia Evaluation  Patient identified by MRN, date of birth, ID band Patient awake    Reviewed: Allergy & Precautions, H&P , NPO status , Patient's Chart, lab work & pertinent test results  History of Anesthesia Complications Negative for: history of anesthetic complications  Airway Mallampati: III  TM Distance: <3 FB Neck ROM: full    Dental  (+) Chipped   Pulmonary neg shortness of breath, former smoker,    Pulmonary exam normal        Cardiovascular Exercise Tolerance: Good (-) angina(-) Past MI and (-) DOE negative cardio ROS Normal cardiovascular exam     Neuro/Psych Seizures -, Well Controlled,  negative psych ROS   GI/Hepatic Neg liver ROS, GERD  Medicated and Controlled,  Endo/Other  diabetes, Insulin Dependent  Renal/GU      Musculoskeletal   Abdominal   Peds  Hematology negative hematology ROS (+)   Anesthesia Other Findings Past Medical History: No date: Diabetes mellitus without complication (HCC) No date: GERD (gastroesophageal reflux disease) No date: Seizures (HCC)     Comment:  due to hypoglycemia-last seizure in september 2020  History reviewed. No pertinent surgical history.  BMI    Body Mass Index: 25.85 kg/m      Reproductive/Obstetrics negative OB ROS                             Anesthesia Physical Anesthesia Plan  ASA: III  Anesthesia Plan: General ETT   Post-op Pain Management:    Induction: Intravenous  PONV Risk Score and Plan: Ondansetron, Dexamethasone, Midazolam and Treatment may vary due to age or medical condition  Airway Management Planned: Oral ETT  Additional Equipment:   Intra-op Plan:   Post-operative Plan: Extubation in OR  Informed Consent: I have reviewed the patients History and Physical, chart, labs and discussed the procedure including the risks, benefits and alternatives for the proposed anesthesia with the patient or  authorized representative who has indicated his/her understanding and acceptance.     Dental Advisory Given  Plan Discussed with: Anesthesiologist, CRNA and Surgeon  Anesthesia Plan Comments: (Patient consented for risks of anesthesia including but not limited to:  - adverse reactions to medications - damage to eyes, teeth, lips or other oral mucosa - nerve damage due to positioning  - sore throat or hoarseness - Damage to heart, brain, nerves, lungs, other parts of body or loss of life  Patient voiced understanding.)        Anesthesia Quick Evaluation

## 2020-06-27 NOTE — Progress Notes (Signed)
Called Dr. Randa Ngo concerning patient's blood glucose of 236 and he stated that if the patient is going home and taking his insulin then we will not correct it here. Patient stated he will administer his insulin when he gets home.

## 2020-06-27 NOTE — Op Note (Signed)
Preoperative diagnosis: Right inguinal hernia (K40.90) Postoperative diagnosis: Same  Procedure: Right inguinal herniorrhaphy CPT [phone])  Surgeon: Suszanne Conners. Evelene Croon MD  Anesthesia: General  Indications:See the history and physical. After informed consent the above procedure(s) were requested     Technique and findings: After adequate general anesthesia been obtained patient's lower abdomen and perineum were prepped and draped in usual fashion.  A right inguinal crease incision was made and carried down sharply through the skin into the subcutaneous fatty tissue with electrocautery.  External oblique fibers were exposed and blunt dissection.  The external fibers were then opened along their course to further expose the spermatic cord.  Cremasterics fibers were divided and the hernia sac was dissected back to the internal ring and ligated with 3-0 chromic.  Using blunt dissection retropubic space was developed and then irrigated with GU irrigant.  The [employer] large PHS L graft was selected and the circular portion unfurled into the retropubic space.  The oblong external portion the graft was opened parallel to the external inguinal fascial fibers.  A keyhole incision was made laterally in the oblong section of the graft and then edges brought around the cord and anchored to the lateral ligament with a 2-0 Surgilon suture.  The distal edge of the external portion graft was then anchored to the pubic tubercle with 2-0 Surgilon suture.  The spermatic cord was placed into its normal anatomic position the external portion of the graft and beneath the external oblique fascia.  The field was then irrigated with GU irrigant.  External oblique fascia was reapproximated with interrupted 2-0 Surgilon suture.  Spermatic cord block was then performed with 10 cc of a mixture of 1% Xylocaine and quarter percent Marcaine.  Subcutaneous block was performed with same solution.  Subcutaneous fatty tissue was reapproximated  with interrupted 3-0 plain gut.  Skin was reapproximated with a 4-0 Vicryl subcuticular closure followed by Dermabond, benzoin and Steri-Strips.  Sterile dressing was applied.  Sponge, needle, and instrument counts were noted to be correct.  Blood loss was minimal.  Procedure was then terminated and patient transferred to the recovery room in stable condition.

## 2020-06-27 NOTE — Anesthesia Procedure Notes (Signed)
Procedure Name: Intubation Performed by: Fletcher-Harrison, Jamiyah Dingley, CRNA Pre-anesthesia Checklist: Patient identified, Emergency Drugs available, Suction available and Patient being monitored Patient Re-evaluated:Patient Re-evaluated prior to induction Oxygen Delivery Method: Circle system utilized Preoxygenation: Pre-oxygenation with 100% oxygen Induction Type: IV induction Ventilation: Mask ventilation without difficulty Tube type: Oral Tube size: 7.0 mm Number of attempts: 1 Airway Equipment and Method: Stylet and Oral airway Placement Confirmation: ETT inserted through vocal cords under direct vision,  positive ETCO2,  breath sounds checked- equal and bilateral and CO2 detector Secured at: 21 cm Tube secured with: Tape Dental Injury: Teeth and Oropharynx as per pre-operative assessment        

## 2020-06-27 NOTE — Transfer of Care (Signed)
Immediate Anesthesia Transfer of Care Note  Patient: Lucas Shaw  Procedure(s) Performed: Procedure(s): RIGHT INGUINAL HERNIA REPAIR (Right) INSERTION OF MESH (Right)  Patient Location: PACU  Anesthesia Type:General  Level of Consciousness: sedated  Airway & Oxygen Therapy: Patient Spontanous Breathing and Patient connected to face mask oxygen  Post-op Assessment: Report given to RN and Post -op Vital signs reviewed and stable  Post vital signs: Reviewed and stable  Last Vitals:  Vitals:   06/27/20 0616 06/27/20 0905  BP: (!) 136/106 136/85  Pulse: 92 92  Resp: 20 14  Temp: 36.6 C 36.9 C  SpO2: 100% 99%    Complications: No apparent anesthesia complications

## 2020-08-05 ENCOUNTER — Other Ambulatory Visit: Payer: Self-pay

## 2020-08-07 ENCOUNTER — Ambulatory Visit: Payer: BC Managed Care – PPO | Admitting: Gastroenterology

## 2020-08-07 ENCOUNTER — Encounter: Payer: Self-pay | Admitting: Gastroenterology

## 2020-08-07 DIAGNOSIS — E10649 Type 1 diabetes mellitus with hypoglycemia without coma: Secondary | ICD-10-CM | POA: Insufficient documentation

## 2020-09-02 ENCOUNTER — Other Ambulatory Visit: Payer: Self-pay | Admitting: Gastroenterology

## 2020-09-26 ENCOUNTER — Other Ambulatory Visit: Payer: Self-pay | Admitting: Gastroenterology

## 2020-10-28 ENCOUNTER — Encounter: Payer: Self-pay | Admitting: Gastroenterology

## 2020-10-28 ENCOUNTER — Ambulatory Visit (INDEPENDENT_AMBULATORY_CARE_PROVIDER_SITE_OTHER): Payer: BC Managed Care – PPO | Admitting: Gastroenterology

## 2020-10-28 ENCOUNTER — Other Ambulatory Visit: Payer: Self-pay

## 2020-10-28 VITALS — BP 146/98 | HR 108 | Temp 98.3°F | Ht 68.0 in | Wt 171.4 lb

## 2020-10-28 DIAGNOSIS — R1013 Epigastric pain: Secondary | ICD-10-CM

## 2020-10-28 DIAGNOSIS — R1310 Dysphagia, unspecified: Secondary | ICD-10-CM

## 2020-10-28 DIAGNOSIS — R1319 Other dysphagia: Secondary | ICD-10-CM | POA: Diagnosis not present

## 2020-10-28 NOTE — Patient Instructions (Signed)
Please find a Primary care physician before your next appointment with our office. [facility] can help you find one by calling [phone]   

## 2020-10-28 NOTE — Progress Notes (Signed)
Lucas Darby, MD [address]  [place]  [place], [place] [address]  Main: [phone]  Fax: [phone]    Gastroenterology Consultation  Referring Provider:     No ref. provider found Primary Care Physician:  Patient, No Pcp Per Primary Gastroenterologist:  Dr. Cephas Shaw Reason for Consultation:     Epigastric pain, dysphagia        HPI:   Lucas Shaw is a 29 y.o. male referred by [facility] ER for consultation & management of epigastric pain, dysphagia.  Patient has history of type 1 diabetes, on insulin, history of dysphagia to solids, originally was seen in the ER end of July 21 secondary to food impaction.  He had similar episodes in the past.  He was medically managed, did not require urgent endoscopic evaluation at that time as the food bolus passed spontaneously.  He has been started on omeprazole 40 mg twice daily since then. He reports globus sensation whenever he tries to swallow irrespective of when he eats or not.  His most recent A1c 7.2 which is the lowest so far.  He also reports mild epigastric discomfort.  He denies abdominal bloating, early satiety, nausea.  His weight has been stable  He does not smoke or drink alcohol  NSAIDs: None  Antiplts/Anticoagulants/Anti thrombotics: None  GI Procedures: None He denies family history of GI malignancy  Past Medical History:  Diagnosis Date  . Diabetes mellitus without complication ([place])   . GERD (gastroesophageal reflux disease)   . Seizures ([place])    due to hypoglycemia-last seizure in september 2020    Past Surgical History:  Procedure Laterality Date  . INGUINAL HERNIA REPAIR Right 06/27/2020   Procedure: RIGHT INGUINAL HERNIA REPAIR;  Surgeon: Royston Cowper, MD;  Location: [facility] ORS;  Service: Urology;  Laterality: Right;  . INSERTION OF MESH Right 06/27/2020   Procedure: INSERTION OF MESH;  Surgeon: Royston Cowper, MD;  Location: [facility] ORS;  Service: Urology;  Laterality: Right;   Current  Outpatient Medications:  .  B-D ULTRAFINE III SHORT PEN 31G X 8 MM MISC, SMARTSIG:Injection 4 Times Daily, Disp: , Rfl:  .  Continuous Blood Gluc Receiver (FREESTYLE LIBRE 2 READER) DEVI, Use to monitor blood glucose., Disp: , Rfl:  .  Continuous Blood Gluc Sensor (FREESTYLE LIBRE 2 SENSOR) MISC, USE 1 KIT EVERY 14 (FOURTEEN) DAYS FOR GLUCOSE MONITORING, Disp: , Rfl:  .  HUMALOG KWIKPEN 100 UNIT/ML KwikPen, Inject 5 Units into the skin 3 (three) times daily before meals., Disp: , Rfl:  .  LANTUS SOLOSTAR 100 UNIT/ML Solostar Pen, Inject 25 Units into the skin at bedtime., Disp: , Rfl:  .  omeprazole (PRILOSEC) 40 MG capsule, TAKE 1 CAPSULE (40 MG TOTAL) BY MOUTH 2 (TWO) TIMES DAILY BEFORE A MEAL., Disp: 180 capsule, Rfl: 0    History reviewed. No pertinent family history.   Social History   Tobacco Use  . Smoking status: Former Smoker    Packs/day: 1.00    Years: 4.00    Pack years: 4.00    Types: Cigarettes  . Smokeless tobacco: Never Used  Vaping Use  . Vaping Use: Never used  Substance Use Topics  . Alcohol use: Yes    Comment: 1-2 liqour and beer most days  . Drug use: Never    Allergies as of 10/28/2020  . (No Known Allergies)    Review of Systems:    All systems reviewed and negative except where noted in HPI.  Physical Exam:  BP (!) 146/98 (BP Location: Left Arm, Patient Position: Sitting, Cuff Size: Normal)   Pulse (!) 108   Temp 98.3 F (36.8 C) (Oral)   Ht 5' 8" (1.727 m)   Wt 171 lb 6 oz (77.7 kg)   BMI 26.06 kg/m  No LMP for male patient.  General:   Alert,  Well-developed, well-nourished, pleasant and cooperative in NAD Head:  Normocephalic and atraumatic. Eyes:  Sclera clear, no icterus.   Conjunctiva pink. Ears:  Normal auditory acuity. Nose:  No deformity, discharge, or lesions. Mouth:  No deformity or lesions,oropharynx pink & moist. Neck:  Supple; no masses or thyromegaly. Lungs:  Respirations even and unlabored.  Clear throughout to  auscultation.   No wheezes, crackles, or rhonchi. No acute distress. Heart:  Regular rate and rhythm; no murmurs, clicks, rubs, or gallops. Abdomen:  Normal bowel sounds. Soft, non-tender and non-distended without masses, hepatosplenomegaly or hernias noted.  No guarding or rebound tenderness.   Rectal: Not performed Msk:  Symmetrical without gross deformities. Good, equal movement & strength bilaterally. Pulses:  Normal pulses noted. Extremities:  No clubbing or edema.  No cyanosis. Neurologic:  Alert and oriented x3;  grossly normal neurologically. Skin:  Intact without significant lesions or rashes. No jaundice. Psych:  Alert and cooperative. Normal mood and affect.  Imaging Studies: No abdominal imaging  Assessment and Plan:   Lucas Shaw is a 29 y.o. male with history of right inguinal hernia repair, type 1 diabetes, GERD is seen in consultation for globus sensation and history of intermittent dysphagia to solids  Recommend EGD with esophageal and gastric biopsies Continue Prilosec 40 mg twice daily for now   Follow up in 2 months, virtual visit   Rohini R Vanga, MD  

## 2020-10-31 ENCOUNTER — Other Ambulatory Visit: Payer: Self-pay

## 2020-10-31 ENCOUNTER — Other Ambulatory Visit
Admission: RE | Admit: 2020-10-31 | Discharge: 2020-10-31 | Disposition: A | Discharge status: Home / Self Care | Payer: BC Managed Care – PPO | Source: Ambulatory Visit | Attending: Gastroenterology | Admitting: Gastroenterology

## 2020-10-31 DIAGNOSIS — Z20822 Contact with and (suspected) exposure to covid-19: Secondary | ICD-10-CM | POA: Insufficient documentation

## 2020-10-31 DIAGNOSIS — Z01812 Encounter for preprocedural laboratory examination: Secondary | ICD-10-CM | POA: Insufficient documentation

## 2020-10-31 LAB — SARS CORONAVIRUS 2 (TAT 6-24 HRS): SARS Coronavirus 2: NEGATIVE

## 2020-11-01 ENCOUNTER — Encounter: Payer: Self-pay | Admitting: Gastroenterology

## 2020-11-04 ENCOUNTER — Ambulatory Visit
Admission: RE | Admit: 2020-11-04 | Discharge: 2020-11-04 | Disposition: A | Discharge status: Home / Self Care | Payer: BC Managed Care – PPO | Attending: Gastroenterology | Admitting: Gastroenterology

## 2020-11-04 ENCOUNTER — Ambulatory Visit: Payer: BC Managed Care – PPO | Admitting: Certified Registered"

## 2020-11-04 ENCOUNTER — Encounter: Payer: Self-pay | Admitting: Gastroenterology

## 2020-11-04 ENCOUNTER — Encounter
Admission: RE | Disposition: A | Discharge status: Home / Self Care | Payer: Self-pay | Source: Home / Self Care | Attending: Gastroenterology

## 2020-11-04 ENCOUNTER — Other Ambulatory Visit: Payer: Self-pay

## 2020-11-04 DIAGNOSIS — Z794 Long term (current) use of insulin: Secondary | ICD-10-CM | POA: Diagnosis not present

## 2020-11-04 DIAGNOSIS — K2100 Gastro-esophageal reflux disease with esophagitis, without bleeding: Secondary | ICD-10-CM | POA: Insufficient documentation

## 2020-11-04 DIAGNOSIS — Z87891 Personal history of nicotine dependence: Secondary | ICD-10-CM | POA: Diagnosis not present

## 2020-11-04 DIAGNOSIS — R1314 Dysphagia, pharyngoesophageal phase: Secondary | ICD-10-CM | POA: Insufficient documentation

## 2020-11-04 DIAGNOSIS — R1310 Dysphagia, unspecified: Secondary | ICD-10-CM

## 2020-11-04 DIAGNOSIS — Z79899 Other long term (current) drug therapy: Secondary | ICD-10-CM | POA: Diagnosis not present

## 2020-11-04 HISTORY — PX: ESOPHAGOGASTRODUODENOSCOPY (EGD) WITH PROPOFOL: SHX5813

## 2020-11-04 LAB — GLUCOSE, CAPILLARY: Glucose-Capillary: 135 mg/dL — ABNORMAL HIGH (ref 70–99)

## 2020-11-04 SURGERY — ESOPHAGOGASTRODUODENOSCOPY (EGD) WITH PROPOFOL
Anesthesia: General

## 2020-11-04 MED ORDER — LIDOCAINE HCL (CARDIAC) PF 100 MG/5ML IV SOSY
PREFILLED_SYRINGE | INTRAVENOUS | Status: DC | PRN
  Administered 2020-11-04: 25 mg via INTRAVENOUS

## 2020-11-04 MED ORDER — SODIUM CHLORIDE 0.9 % IV SOLN: INTRAVENOUS | Status: DC

## 2020-11-04 MED ORDER — PROPOFOL 500 MG/50ML IV EMUL
INTRAVENOUS | Status: DC | PRN
  Administered 2020-11-04: 125 ug/kg/min via INTRAVENOUS

## 2020-11-04 MED ORDER — PROPOFOL 500 MG/50ML IV EMUL
INTRAVENOUS | Status: AC
  Filled 2020-11-04: qty 50

## 2020-11-04 MED ORDER — PROPOFOL 10 MG/ML IV BOLUS
INTRAVENOUS | Status: DC | PRN
  Administered 2020-11-04: 60 mg via INTRAVENOUS
  Administered 2020-11-04: 40 mg via INTRAVENOUS
  Administered 2020-11-04: 30 mg via INTRAVENOUS

## 2020-11-04 NOTE — Anesthesia Preprocedure Evaluation (Signed)
Anesthesia Evaluation  Patient identified by MRN, date of birth, ID band Patient awake    Reviewed: Allergy & Precautions, H&P , NPO status , Patient's Chart, lab work & pertinent test results  History of Anesthesia Complications Negative for: history of anesthetic complications  Airway Mallampati: III  TM Distance: >3 FB     Dental  (+) Chipped   Pulmonary neg sleep apnea, neg COPD, former smoker,    breath sounds clear to auscultation       Cardiovascular (-) angina(-) Past MI and (-) Cardiac Stents negative cardio ROS  (-) dysrhythmias  Rhythm:regular Rate:Normal     Neuro/Psych negative neurological ROS  negative psych ROS   GI/Hepatic Neg liver ROS, GERD  Controlled,  Endo/Other  diabetes  Renal/GU negative Renal ROS  negative genitourinary   Musculoskeletal   Abdominal   Peds  Hematology negative hematology ROS (+)   Anesthesia Other Findings Past Medical History: No date: Diabetes mellitus without complication ([facility]) No date: GERD (gastroesophageal reflux disease) No date: Seizures ([facility])     Comment:  due to hypoglycemia-last seizure in september 2020  Past Surgical History: 06/27/2020: INGUINAL HERNIA REPAIR; Right     Comment:  Procedure: RIGHT INGUINAL HERNIA REPAIR;  Surgeon:               Orson Ape, MD;  Location: [facility] ORS;  Service:               Urology;  Laterality: Right; 06/27/2020: INSERTION OF MESH; Right     Comment:  Procedure: INSERTION OF MESH;  Surgeon: Orson Ape, MD;  Location: [facility] ORS;  Service: Urology;                Laterality: Right;  BMI    Body Mass Index: 25.85 kg/m      Reproductive/Obstetrics negative OB ROS                             Anesthesia Physical Anesthesia Plan  ASA: II  Anesthesia Plan: General   Post-op Pain Management:    Induction:   PONV Risk Score and Plan: Propofol infusion and  TIVA  Airway Management Planned:   Additional Equipment:   Intra-op Plan:   Post-operative Plan:   Informed Consent: I have reviewed the patients History and Physical, chart, labs and discussed the procedure including the risks, benefits and alternatives for the proposed anesthesia with the patient or authorized representative who has indicated his/her understanding and acceptance.     Dental Advisory Given  Plan Discussed with: Anesthesiologist, CRNA and Surgeon  Anesthesia Plan Comments:         Anesthesia Quick Evaluation

## 2020-11-04 NOTE — OR Nursing (Signed)
Blood pressure ( dyystolic ) elevated the patient states he has white coat syndrome and does not take any medicines for bp. I called DR Jennet Maduro and she advised that he could go if he was not having chest pain or headache. He did not have these symptoms so I discharged him.

## 2020-11-04 NOTE — Transfer of Care (Signed)
Immediate Anesthesia Transfer of Care Note  Patient: Lucas Shaw  Procedure(s) Performed: ESOPHAGOGASTRODUODENOSCOPY (EGD) WITH PROPOFOL (N/A )  Patient Location: PACU and Endoscopy Unit  Anesthesia Type:General  Level of Consciousness: drowsy and patient cooperative  Airway & Oxygen Therapy: Patient Spontanous Breathing  Post-op Assessment: Report given to RN and Post -op Vital signs reviewed and stable  Post vital signs: Reviewed and stable  Last Vitals:  Vitals Value Taken Time  BP 129/100 11/04/20 1040  Temp    Pulse 94 11/04/20 1041  Resp 14 11/04/20 1041  SpO2 97 % 11/04/20 1041  Vitals shown include unvalidated device data.  Last Pain:  Vitals:   11/04/20 0946  TempSrc: Temporal  PainSc: 0-No pain         Complications: No complications documented.

## 2020-11-04 NOTE — H&P (Signed)
Lucas Darby, MD [address]  [place]  [place], [place] [address]  Main: [phone]  Fax: [phone] Pager: [phone]  Primary Care Physician:  Patient, No Pcp Per Primary Gastroenterologist:  Dr. Cephas Shaw  Pre-Procedure History & Physical: HPI:  Lucas Shaw is a 29 y.o. male is here for an endoscopy.   Past Medical History:  Diagnosis Date  . Diabetes mellitus without complication ([place])   . GERD (gastroesophageal reflux disease)   . Seizures ([place])    due to hypoglycemia-last seizure in september 2020    Past Surgical History:  Procedure Laterality Date  . INGUINAL HERNIA REPAIR Right 06/27/2020   Procedure: RIGHT INGUINAL HERNIA REPAIR;  Surgeon: Royston Cowper, MD;  Location: [facility] ORS;  Service: Urology;  Laterality: Right;  . INSERTION OF MESH Right 06/27/2020   Procedure: INSERTION OF MESH;  Surgeon: Royston Cowper, MD;  Location: [facility] ORS;  Service: Urology;  Laterality: Right;    Prior to Admission medications   Medication Sig Start Date End Date Taking? Authorizing Provider  B-D ULTRAFINE III SHORT PEN 31G X 8 MM MISC SMARTSIG:Injection 4 Times Daily 06/11/20   [provider]  Continuous Blood Gluc Receiver (FREESTYLE LIBRE 2 READER) DEVI Use to monitor blood glucose. 09/04/19   [provider]  Continuous Blood Gluc Sensor (FREESTYLE LIBRE 2 SENSOR) MISC USE 1 KIT EVERY 14 (FOURTEEN) DAYS FOR GLUCOSE MONITORING 06/11/20   [provider]  HUMALOG KWIKPEN 100 UNIT/ML KwikPen Inject 5 Units into the skin 3 (three) times daily before meals. 02/25/19   [provider]  LANTUS SOLOSTAR 100 UNIT/ML Solostar Pen Inject 25 Units into the skin at bedtime. 06/11/19   [provider]  omeprazole (PRILOSEC) 40 MG capsule TAKE 1 CAPSULE (40 MG TOTAL) BY MOUTH 2 (TWO) TIMES DAILY BEFORE A MEAL. 09/26/20   Lin Landsman, MD    Allergies as of 10/28/2020  . (No Known Allergies)    History reviewed. No  pertinent family history.  Social History   Socioeconomic History  . Marital status: Single    Spouse name: Not on file  . Number of children: Not on file  . Years of education: Not on file  . Highest education level: Not on file  Occupational History  . Not on file  Tobacco Use  . Smoking status: Former Smoker    Packs/day: 1.00    Years: 4.00    Pack years: 4.00    Types: Cigarettes  . Smokeless tobacco: Never Used  Vaping Use  . Vaping Use: Never used  Substance and Sexual Activity  . Alcohol use: Yes    Comment: 1-2 liqour and beer most days  . Drug use: Never  . Sexual activity: Yes  Other Topics Concern  . Not on file  Social History Narrative  . Not on file   Social Determinants of Health   Financial Resource Strain: Not on file  Food Insecurity: Not on file  Transportation Needs: Not on file  Physical Activity: Not on file  Stress: Not on file  Social Connections: Not on file  Intimate Partner Violence: Not on file    Review of Systems: See HPI, otherwise negative ROS  Physical Exam: BP (!) 144/85   Pulse 100   Temp 98.8 F (37.1 C) (Temporal)   Resp 14   Ht _0  (1.727 m)   Wt 77.1 kg   SpO2 96%   BMI 25.85 kg/m  General:   Alert,  pleasant and cooperative in NAD Head:  Normocephalic and atraumatic. Neck:  Supple; no masses or thyromegaly. Lungs:  Clear throughout to auscultation.    Heart:  Regular rate and rhythm. Abdomen:  Soft, nontender and nondistended. Normal bowel sounds, without guarding, and without rebound.   Neurologic:  Alert and  oriented x4;  grossly normal neurologically.  Impression/Plan: Lucas Shaw is here for an endoscopy to be performed for globus sensation and history of intermittent dysphagia to solids  Risks, benefits, limitations, and alternatives regarding  endoscopy have been reviewed with the patient.  Questions have been answered.  All parties agreeable.   Sherri Sear, MD  11/04/2020, 10:02 AM

## 2020-11-04 NOTE — Op Note (Signed)
[facility] Gastroenterology Patient Name: Lucas Shaw Procedure Date: 11/04/2020 10:23 AM MRN: [id number] Account #: [id number] Date of Birth: Nov 05, 1991 Admit Type: Outpatient Age: 29 Room: [facility] ENDO ROOM 1 Gender: Male Note Status: Finalized Procedure:             Upper GI endoscopy Indications:           Esophageal dysphagia Providers:             Toney Reil MD, MD Referring MD:          No Local Md, MD (Referring MD) Medicines:             General Anesthesia Complications:         No immediate complications. Estimated blood loss: None. Procedure:             Pre-Anesthesia Assessment:                        - Prior to the procedure, a History and Physical was                         performed, and patient medications and allergies were                         reviewed. The patient is competent. The risks and                         benefits of the procedure and the sedation options and                         risks were discussed with the patient. All questions                         were answered and informed consent was obtained.                         Patient identification and proposed procedure were                         verified by the physician, the nurse, the                         anesthesiologist, the anesthetist and the technician                         in the pre-procedure area in the procedure room in the                         endoscopy suite. Mental Status Examination: alert and                         oriented. Airway Examination: normal oropharyngeal                         airway and neck mobility. Respiratory Examination:                         clear to auscultation. CV Examination: normal.  Prophylactic Antibiotics: The patient does not require                         prophylactic antibiotics. Prior Anticoagulants: The                         patient has taken no previous anticoagulant or                          antiplatelet agents. ASA Grade Assessment: II - A                         patient with mild systemic disease. After reviewing                         the risks and benefits, the patient was deemed in                         satisfactory condition to undergo the procedure. The                         anesthesia plan was to use general anesthesia.                         Immediately prior to administration of medications,                         the patient was re-assessed for adequacy to receive                         sedatives. The heart rate, respiratory rate, oxygen                         saturations, blood pressure, adequacy of pulmonary                         ventilation, and response to care were monitored                         throughout the procedure. The physical status of the                         patient was re-assessed after the procedure.                        After obtaining informed consent, the endoscope was                         passed under direct vision. Throughout the procedure,                         the patient's blood pressure, pulse, and oxygen                         saturations were monitored continuously. The Endoscope                         was introduced through the mouth, and advanced to the  second part of duodenum. The upper GI endoscopy was                         accomplished without difficulty. The patient tolerated                         the procedure well. Findings:      The duodenal bulb and second portion of the duodenum were normal.      Diffuse mildly erythematous mucosa without bleeding was found in the       gastric body and in the gastric antrum. Biopsies were taken with a cold       forceps for Helicobacter pylori testing.      The cardia and gastric fundus were normal on retroflexion.      Esophagogastric landmarks were identified: the gastroesophageal junction       was found at 39 cm from the  incisors.      Mucosal changes including longitudinal furrows and congestion (edema)       were found in the entire esophagus. Esophageal findings were graded       using the Eosinophilic Esophagitis Endoscopic Reference Score       (EoE-EREFS) as: Edema Grade 1 Present (decreased clarity or absence of       vascular markings), Rings Grade 0 None (no ridges or rings seen),       Exudates Grade 1 Mild (scattered white lesions involving less than 10       percent of the esophageal surface area), Furrows Grade 1 Present       (vertical lines with or without visible depth) and Stricture none (no       stricture found). Biopsies were obtained from the proximal and distal       esophagus with cold forceps for histology of suspected eosinophilic       esophagitis. Impression:            - Normal duodenal bulb and second portion of the                         duodenum.                        - Erythematous mucosa in the gastric body and antrum.                         Biopsied.                        - Esophagogastric landmarks identified.                        - Esophageal mucosal changes suspicious for                         eosinophilic esophagitis. Biopsied. Recommendation:        - Discharge patient to home (with escort).                        - Resume previous diet today.                        - Continue present medications.                        -  Await pathology results.                        - Return to my office as previously scheduled. Procedure Code(s):     --- Professional ---                        [phone], Esophagogastroduodenoscopy, flexible,                         transoral; with biopsy, single or multiple Diagnosis Code(s):     --- Professional ---                        K31.89, Other diseases of stomach and duodenum                        K22.8, Other specified diseases of esophagus                        R13.14, Dysphagia, pharyngoesophageal phase CPT copyright 2019  American Medical Association. All rights reserved. The codes documented in this report are preliminary and upon coder review may  be revised to meet current compliance requirements. Dr. Libby Maw Toney Reil MD, MD 11/04/2020 10:39:34 AM This report has been signed electronically. Number of Addenda: 0 Note Initiated On: 11/04/2020 10:23 AM Estimated Blood Loss:  Estimated blood loss: none.      [facility]

## 2020-11-05 ENCOUNTER — Other Ambulatory Visit: Payer: Self-pay | Admitting: Gastroenterology

## 2020-11-05 ENCOUNTER — Encounter: Payer: Self-pay | Admitting: Gastroenterology

## 2020-11-05 DIAGNOSIS — K200 Eosinophilic esophagitis: Secondary | ICD-10-CM

## 2020-11-05 LAB — SURGICAL PATHOLOGY

## 2020-11-05 MED ORDER — FLUTICASONE PROPIONATE HFA 220 MCG/ACT IN AERO: INHALATION_SPRAY | RESPIRATORY_TRACT | 2 refills | Status: DC

## 2020-11-05 NOTE — Anesthesia Postprocedure Evaluation (Signed)
Anesthesia Post Note  Patient: Lucas Shaw  Procedure(s) Performed: ESOPHAGOGASTRODUODENOSCOPY (EGD) WITH PROPOFOL (N/A )  Patient location during evaluation: PACU Anesthesia Type: General Level of consciousness: awake and alert Pain management: pain level controlled Vital Signs Assessment: post-procedure vital signs reviewed and stable Respiratory status: spontaneous breathing, nonlabored ventilation and respiratory function stable Cardiovascular status: blood pressure returned to baseline and stable Postop Assessment: no apparent nausea or vomiting Anesthetic complications: no   No complications documented.   Last Vitals:  Vitals:   11/04/20 1100 11/04/20 1110  BP: (!) 131/104 (!) 146/93  Pulse: 87 89  Resp: 11 12  Temp:    SpO2: 98% 99%    Last Pain:  Vitals:   11/05/20 0737  TempSrc:   PainSc: 0-No pain                 Karleen Hampshire

## 2020-12-22 ENCOUNTER — Other Ambulatory Visit: Payer: Self-pay | Admitting: Gastroenterology

## 2020-12-30 ENCOUNTER — Telehealth (INDEPENDENT_AMBULATORY_CARE_PROVIDER_SITE_OTHER): Payer: BC Managed Care – PPO | Admitting: Gastroenterology

## 2020-12-30 ENCOUNTER — Other Ambulatory Visit: Payer: Self-pay

## 2020-12-30 ENCOUNTER — Telehealth: Payer: Self-pay

## 2020-12-30 ENCOUNTER — Encounter: Payer: Self-pay | Admitting: Gastroenterology

## 2020-12-30 DIAGNOSIS — K200 Eosinophilic esophagitis: Secondary | ICD-10-CM

## 2020-12-30 MED ORDER — FLUTICASONE PROPIONATE HFA 220 MCG/ACT IN AERO: INHALATION_SPRAY | RESPIRATORY_TRACT | 2 refills | Status: AC

## 2020-12-30 NOTE — Telephone Encounter (Signed)
Patient is scheduled for 02/10/2021 for a EGD and COVID test on 02/06/2021. Sent instructions to mychart and went over instructions with patient

## 2020-12-30 NOTE — Progress Notes (Signed)
Lucas Sear, MD [address]  [place]  [place], [place] [address]  Main: [phone]  Fax: [phone]    Gastroenterology Consultation Video Visit  Referring Provider:     No ref. provider found Primary Care Physician:  Patient, No Pcp Per Primary Gastroenterologist:  Dr. Cephas Darby Reason for Consultation:     Eosinophilic esophagitis        HPI:   Lucas Shaw is a 30 y.o. male referred by Dr. Patient, No Pcp Per  for consultation & management of eosinophilic esophagitis  Virtual Visit Video Note  I connected with Lucas Shaw on 12/30/20 at  3:30 PM EST by video and verified that I am speaking with the correct person using two identifiers.   I discussed the limitations, risks, security and privacy concerns of performing an evaluation and management service by video and the availability of in person appointments. I also discussed with the patient that there may be a patient responsible charge related to this service. The patient expressed understanding and agreed to proceed.  Location of the Patient: Home  Location of the provider: Office  Persons participating in the visit: Patient and provider only   History of Present Illness: Lucas Shaw is diagnosed with eosinophilic esophagitis biopsy-proven in 12/21, previously on optimal PPI therapy, switched to swallowed fluticasone spray.  He has been on steroid medication for 2 months.  Patient noticed a significant improvement in difficulty swallowing.  He is able to tolerate grilled chicken without any chest discomfort.  Has not tried fried foods or any hard meats yet  Patient is currently off PPI  NSAIDs: None  Antiplts/Anticoagulants/Anti thrombotics: None  GI Procedures:  11/04/2020 upper endoscopy - Normal duodenal bulb and second portion of the duodenum. - Erythematous mucosa in the gastric body and antrum. Biopsied. - Esophagogastric landmarks identified. - Esophageal mucosal changes  suspicious for eosinophilic esophagitis. Biopsied.  DIAGNOSIS:  A. STOMACH; COLD BIOPSY:  - GASTRIC ANTRAL AND OXYNTIC MUCOSA WITH FEATURES OF REACTIVE  GASTROPATHY.  - NEGATIVE FOR H. PYLORI, DYSPLASIA, AND MALIGNANCY.   B. ESOPHAGUS, DISTAL; COLD BIOPSY:  - SQUAMOUS MUCOSA WITH ACANTHOSIS, SPONGIOSIS, AND SIGNIFICANTLY  INCREASED EOSINOPHILS (UP TO 50 PER HPF).  - NEGATIVE FOR DYSPLASIA AND MALIGNANCY.   C. ESOPHAGUS, PROXIMAL; COLD BIOPSY:  - SQUAMOUS MUCOSA WITH ACANTHOSIS, SPONGIOSIS, AND MARKEDLY INCREASED  EOSINOPHILS (GREATER THAN 50 PER HPF).  - NEGATIVE FOR DYSPLASIA AND MALIGNANCY.   Past Medical History:  Diagnosis Date  . Diabetes mellitus without complication ([place])   . GERD (gastroesophageal reflux disease)   . Seizures ([place])    due to hypoglycemia-last seizure in september 2020    Past Surgical History:  Procedure Laterality Date  . ESOPHAGOGASTRODUODENOSCOPY (EGD) WITH PROPOFOL N/A 11/04/2020   Procedure: ESOPHAGOGASTRODUODENOSCOPY (EGD) WITH PROPOFOL;  Surgeon: Lin Landsman, MD;  Location: [place] [facility];  Service: Gastroenterology;  Laterality: N/A;  . INGUINAL HERNIA REPAIR Right 06/27/2020   Procedure: RIGHT INGUINAL HERNIA REPAIR;  Surgeon: Royston Cowper, MD;  Location: [facility];  Service: Urology;  Laterality: Right;  . INSERTION OF MESH Right 06/27/2020   Procedure: INSERTION OF MESH;  Surgeon: Royston Cowper, MD;  Location: [facility];  Service: Urology;  Laterality: Right;    Current Outpatient Medications:  .  B-D ULTRAFINE III SHORT PEN 31G X 8 MM MISC, SMARTSIG:Injection 4 Times Daily, Disp: , Rfl:  .  Continuous Blood Gluc Receiver (FREESTYLE LIBRE 2 READER) DEVI, Use to monitor blood glucose., Disp: ,  Rfl:  .  Continuous Blood Gluc Sensor (FREESTYLE LIBRE 2 SENSOR) MISC, USE 1 KIT EVERY 14 (FOURTEEN) DAYS FOR GLUCOSE MONITORING, Disp: , Rfl:  .  fluticasone (FLOVENT HFA) 220 MCG/ACT inhaler, Swallow 2 puffs 2 times a day, do not eat or  drink for 30 minutes after each swallow and rinse mouth with water and spit out after each swallow, Disp: 1 each, Rfl: 2 .  HUMALOG KWIKPEN 100 UNIT/ML KwikPen, Inject 5 Units into the skin 3 (three) times daily before meals., Disp: , Rfl:  .  LANTUS SOLOSTAR 100 UNIT/ML Solostar Pen, Inject 25 Units into the skin at bedtime., Disp: , Rfl:  .  omeprazole (PRILOSEC) 40 MG capsule, TAKE 1 CAPSULE (40 MG TOTAL) BY MOUTH 2 (TWO) TIMES DAILY BEFORE A MEAL., Disp: 180 capsule, Rfl: 0    History reviewed. No pertinent family history.   Social History   Tobacco Use  . Smoking status: Former Smoker    Packs/day: 1.00    Years: 4.00    Pack years: 4.00    Types: Cigarettes  . Smokeless tobacco: Never Used  Vaping Use  . Vaping Use: Never used  Substance Use Topics  . Alcohol use: Yes    Comment: 1-2 liqour and beer most days  . Drug use: Never    Allergies as of 12/30/2020  . (No Known Allergies)     Imaging Studies: No abdominal imaging  Assessment and Plan:   Lucas Shaw is a 30 y.o. Caucasian male with history of type 1 diabetes, YGE7U 7.2, eosinophilic esophagitis biopsy-proven in 12/21, currently maintained on swallowed fluticasone.  Eosinophilic esophagitis Continue swallowed fluticasone 2 sprays twice daily Recommend EGD in 1 month with repeat biopsies to assess response to therapy If persistent, recommend six food elimination diet   Follow Up Instructions:   I discussed the assessment and treatment plan with the patient. The patient was provided an opportunity to ask questions and all were answered. The patient agreed with the plan and demonstrated an understanding of the instructions.   The patient was advised to call back or seek an in-person evaluation if the symptoms worsen or if the condition fails to improve as anticipated.  I provided 11 minutes of face-to-face time during this encounter.   Follow up in 3-4 months   Cephas Darby, MD

## 2020-12-30 NOTE — Telephone Encounter (Signed)
-----   Message from Toney Reil, MD sent at 12/30/2020  3:54 PM EST ----- Regarding: EGD Please schedule EGD in end of March for follow-up of eosinophilic esophagitis  Thanks RV

## 2021-03-03 ENCOUNTER — Ambulatory Visit: Payer: BC Managed Care – PPO | Admitting: Certified Registered Nurse Anesthetist

## 2021-03-03 ENCOUNTER — Encounter: Payer: Self-pay | Admitting: Gastroenterology

## 2021-03-03 ENCOUNTER — Ambulatory Visit
Admission: RE | Admit: 2021-03-03 | Discharge: 2021-03-03 | Disposition: A | Discharge status: Home / Self Care | Payer: BC Managed Care – PPO | Attending: Gastroenterology | Admitting: Gastroenterology

## 2021-03-03 ENCOUNTER — Encounter
Admission: RE | Disposition: A | Discharge status: Home / Self Care | Payer: Self-pay | Source: Home / Self Care | Attending: Gastroenterology

## 2021-03-03 DIAGNOSIS — Z794 Long term (current) use of insulin: Secondary | ICD-10-CM | POA: Insufficient documentation

## 2021-03-03 DIAGNOSIS — Z79899 Other long term (current) drug therapy: Secondary | ICD-10-CM | POA: Insufficient documentation

## 2021-03-03 DIAGNOSIS — Z09 Encounter for follow-up examination after completed treatment for conditions other than malignant neoplasm: Secondary | ICD-10-CM | POA: Insufficient documentation

## 2021-03-03 DIAGNOSIS — K200 Eosinophilic esophagitis: Secondary | ICD-10-CM | POA: Diagnosis not present

## 2021-03-03 DIAGNOSIS — E10649 Type 1 diabetes mellitus with hypoglycemia without coma: Secondary | ICD-10-CM

## 2021-03-03 DIAGNOSIS — K2289 Other specified disease of esophagus: Secondary | ICD-10-CM | POA: Diagnosis not present

## 2021-03-03 DIAGNOSIS — Z87891 Personal history of nicotine dependence: Secondary | ICD-10-CM | POA: Insufficient documentation

## 2021-03-03 HISTORY — PX: ESOPHAGOGASTRODUODENOSCOPY (EGD) WITH PROPOFOL: SHX5813

## 2021-03-03 LAB — GLUCOSE, CAPILLARY: Glucose-Capillary: 173 mg/dL — ABNORMAL HIGH (ref 70–99)

## 2021-03-03 SURGERY — ESOPHAGOGASTRODUODENOSCOPY (EGD) WITH PROPOFOL
Anesthesia: General

## 2021-03-03 MED ORDER — PROPOFOL 500 MG/50ML IV EMUL
INTRAVENOUS | Status: DC | PRN
  Administered 2021-03-03: 150 ug/kg/min via INTRAVENOUS

## 2021-03-03 MED ORDER — GLYCOPYRROLATE 0.2 MG/ML IJ SOLN
INTRAMUSCULAR | Status: DC | PRN
  Administered 2021-03-03: .2 mg via INTRAVENOUS

## 2021-03-03 MED ORDER — PROPOFOL 500 MG/50ML IV EMUL
INTRAVENOUS | Status: AC
  Filled 2021-03-03: qty 250

## 2021-03-03 MED ORDER — LIDOCAINE HCL (CARDIAC) PF 100 MG/5ML IV SOSY
PREFILLED_SYRINGE | INTRAVENOUS | Status: DC | PRN
  Administered 2021-03-03: 100 mg via INTRAVENOUS

## 2021-03-03 MED ORDER — SODIUM CHLORIDE 0.9 % IV SOLN
INTRAVENOUS | Status: DC
  Administered 2021-03-03: 20 mL/h via INTRAVENOUS

## 2021-03-03 MED ORDER — DEXMEDETOMIDINE (PRECEDEX) IN NS 20 MCG/5ML (4 MCG/ML) IV SYRINGE
PREFILLED_SYRINGE | INTRAVENOUS | Status: DC | PRN
  Administered 2021-03-03 (×2): 4 ug via INTRAVENOUS

## 2021-03-03 MED ORDER — PROPOFOL 10 MG/ML IV BOLUS
INTRAVENOUS | Status: DC | PRN
  Administered 2021-03-03: 50 mg via INTRAVENOUS
  Administered 2021-03-03: 80 mg via INTRAVENOUS
  Administered 2021-03-03: 20 mg via INTRAVENOUS

## 2021-03-03 NOTE — Anesthesia Preprocedure Evaluation (Signed)
Anesthesia Evaluation  Patient identified by MRN, date of birth, ID band Patient awake    Reviewed: Allergy & Precautions, H&P , NPO status , Patient's Chart, lab work & pertinent test results  History of Anesthesia Complications Negative for: history of anesthetic complications  Airway Mallampati: III  TM Distance: >3 FB     Dental  (+) Chipped, Dental Advidsory Given   Pulmonary neg shortness of breath, neg sleep apnea, neg COPD, neg recent URI, former smoker,    breath sounds clear to auscultation       Cardiovascular (-) angina(-) Past MI and (-) Cardiac Stents negative cardio ROS  (-) dysrhythmias  Rhythm:regular Rate:Normal     Neuro/Psych negative neurological ROS  negative psych ROS   GI/Hepatic Neg liver ROS, GERD  Controlled,  Endo/Other  diabetes  Renal/GU negative Renal ROS  negative genitourinary   Musculoskeletal   Abdominal   Peds  Hematology negative hematology ROS (+)   Anesthesia Other Findings Past Medical History: No date: Diabetes mellitus without complication ([facility]) No date: GERD (gastroesophageal reflux disease) No date: Seizures ([facility])     Comment:  due to hypoglycemia-last seizure in september 2020  Past Surgical History: 06/27/2020: INGUINAL HERNIA REPAIR; Right     Comment:  Procedure: RIGHT INGUINAL HERNIA REPAIR;  Surgeon:               Orson Ape, MD;  Location: [facility];  Service:               Urology;  Laterality: Right; 06/27/2020: INSERTION OF MESH; Right     Comment:  Procedure: INSERTION OF MESH;  Surgeon: Orson Ape, MD;  Location: [facility];  Service: Urology;                Laterality: Right;  BMI    Body Mass Index: 25.85 kg/m      Reproductive/Obstetrics negative OB ROS                             Anesthesia Physical  Anesthesia Plan  ASA: II  Anesthesia Plan: General   Post-op Pain Management:     Induction: Intravenous  PONV Risk Score and Plan: Propofol infusion and TIVA  Airway Management Planned: Natural Airway and Nasal Cannula  Additional Equipment:   Intra-op Plan:   Post-operative Plan:   Informed Consent: I have reviewed the patients History and Physical, chart, labs and discussed the procedure including the risks, benefits and alternatives for the proposed anesthesia with the patient or authorized representative who has indicated his/her understanding and acceptance.     Dental Advisory Given  Plan Discussed with: Anesthesiologist, CRNA and Surgeon  Anesthesia Plan Comments:         Anesthesia Quick Evaluation

## 2021-03-03 NOTE — Transfer of Care (Signed)
Immediate Anesthesia Transfer of Care Note  Patient: Lucas Shaw  Procedure(s) Performed: ESOPHAGOGASTRODUODENOSCOPY (EGD) WITH PROPOFOL (N/A )  Patient Location: Endoscopy Unit  Anesthesia Type:General  Level of Consciousness: drowsy  Airway & Oxygen Therapy: Patient Spontanous Breathing  Post-op Assessment: Report given to RN and Post -op Vital signs reviewed and stable  Post vital signs: Reviewed and stable  Last Vitals:  Vitals Value Taken Time  BP 126/83 03/03/21 0827  Temp    Pulse 96 03/03/21 0827  Resp 17 03/03/21 0827  SpO2 97 % 03/03/21 0827  Vitals shown include unvalidated device data.  Last Pain:  Vitals:   03/03/21 0827  TempSrc:   PainSc: Asleep         Complications: No complications documented.

## 2021-03-03 NOTE — Op Note (Signed)
[facility] Gastroenterology Patient Name: Lucas Shaw Procedure Date: 03/03/2021 7:22 AM MRN: [id number] Account #: [id number] Date of Birth: 1991/01/27 Admit Type: Outpatient Age: 30 Room: [facility] ENDO ROOM 1 Gender: Male Note Status: Finalized Procedure:             Upper GI endoscopy Indications:           Follow-up of eosinophilic esophagitis Providers:             Toney Reil MD, MD Medicines:             General Anesthesia Complications:         No immediate complications. Estimated blood loss: None. Procedure:             Pre-Anesthesia Assessment:                        - Prior to the procedure, a History and Physical was                         performed, and patient medications and allergies were                         reviewed. The patient is competent. The risks and                         benefits of the procedure and the sedation options and                         risks were discussed with the patient. All questions                         were answered and informed consent was obtained.                         Patient identification and proposed procedure were                         verified by the physician, the nurse, the                         anesthesiologist, the anesthetist and the technician                         in the pre-procedure area in the procedure room in the                         endoscopy suite. Mental Status Examination: alert and                         oriented. Airway Examination: normal oropharyngeal                         airway and neck mobility. Respiratory Examination:                         clear to auscultation. CV Examination: normal.                         Prophylactic Antibiotics: The patient does not  require                         prophylactic antibiotics. Prior Anticoagulants: The                         patient has taken no previous anticoagulant or                         antiplatelet agents.  ASA Grade Assessment: II - A                         patient with mild systemic disease. After reviewing                         the risks and benefits, the patient was deemed in                         satisfactory condition to undergo the procedure. The                         anesthesia plan was to use general anesthesia.                         Immediately prior to administration of medications,                         the patient was re-assessed for adequacy to receive                         sedatives. The heart rate, respiratory rate, oxygen                         saturations, blood pressure, adequacy of pulmonary                         ventilation, and response to care were monitored                         throughout the procedure. The physical status of the                         patient was re-assessed after the procedure.                        After obtaining informed consent, the endoscope was                         passed under direct vision. Throughout the procedure,                         the patient's blood pressure, pulse, and oxygen                         saturations were monitored continuously. The Endoscope                         was introduced through the mouth, and advanced to the  second part of duodenum. The upper GI endoscopy was                         accomplished without difficulty. The patient tolerated                         the procedure well. Findings:      The examined duodenum was normal.      The entire examined stomach was normal.      The gastroesophageal junction and examined esophagus were normal.       Biopsies were obtained from the proximal and distal esophagus with cold       forceps for histology of suspected eosinophilic esophagitis. Estimated       blood loss: none. Impression:            - Normal examined duodenum.                        - Normal stomach.                        - Normal gastroesophageal  junction and esophagus.                         Biopsied. Recommendation:        - Discharge patient to home (with escort).                        - Resume previous diet today.                        - Continue present medications.                        - Await pathology results.                        - Return to my office as previously scheduled. Procedure Code(s):     --- Professional ---                        [phone], Esophagogastroduodenoscopy, flexible,                         transoral; with biopsy, single or multiple Diagnosis Code(s):     --- Professional ---                        K20.0, Eosinophilic esophagitis CPT copyright 2019 American Medical Association. All rights reserved. The codes documented in this report are preliminary and upon coder review may  be revised to meet current compliance requirements. Dr. Libby Maw Toney Reil MD, MD 03/03/2021 8:24:07 AM This report has been signed electronically. Number of Addenda: 0 Note Initiated On: 03/03/2021 7:22 AM Estimated Blood Loss:  Estimated blood loss: none.      [facility]

## 2021-03-03 NOTE — H&P (Signed)
Lucas Darby, MD [address]  [place]  [place], [place] [address]  Main: [phone]  Fax: [phone] Pager: [phone]  Primary Care Physician:  Patient, No Pcp Per (Inactive) Primary Gastroenterologist:  Dr. Cephas Shaw  Pre-Procedure History & Physical: HPI:  Lucas Shaw is a 30 y.o. male is here for an endoscopy.   Past Medical History:  Diagnosis Date  . Diabetes mellitus without complication ([place])   . GERD (gastroesophageal reflux disease)   . Seizures ([place])    due to hypoglycemia-last seizure in september 2020    Past Surgical History:  Procedure Laterality Date  . ESOPHAGOGASTRODUODENOSCOPY (EGD) WITH PROPOFOL N/A 11/04/2020   Procedure: ESOPHAGOGASTRODUODENOSCOPY (EGD) WITH PROPOFOL;  Surgeon: Lin Landsman, MD;  Location: [place];  Service: Gastroenterology;  Laterality: N/A;  . INGUINAL HERNIA REPAIR Right 06/27/2020   Procedure: RIGHT INGUINAL HERNIA REPAIR;  Surgeon: Royston Cowper, MD;  Location: [facility];  Service: Urology;  Laterality: Right;  . INSERTION OF MESH Right 06/27/2020   Procedure: INSERTION OF MESH;  Surgeon: Royston Cowper, MD;  Location: [facility];  Service: Urology;  Laterality: Right;    Prior to Admission medications   Medication Sig Start Date End Date Taking? Authorizing Provider  B-D ULTRAFINE III SHORT PEN 31G X 8 MM MISC SMARTSIG:Injection 4 Times Daily 06/11/20  Yes [provider]  Continuous Blood Gluc Receiver (FREESTYLE LIBRE 2 READER) DEVI Use to monitor blood glucose. 09/04/19  Yes [provider]  Continuous Blood Gluc Sensor (FREESTYLE LIBRE 2 SENSOR) MISC USE 1 KIT EVERY 14 (FOURTEEN) DAYS FOR GLUCOSE MONITORING 06/11/20  Yes [provider]  fluticasone (FLOVENT HFA) 220 MCG/ACT inhaler Swallow 2 puffs 2 times a day, do not eat or drink for 30 minutes after each swallow and rinse mouth with water and spit out after each swallow 12/30/20  Yes Shevawn Langenberg, Tally Due, MD   HUMALOG KWIKPEN 100 UNIT/ML KwikPen Inject 5 Units into the skin 3 (three) times daily before meals. 02/25/19  Yes [provider]  LANTUS SOLOSTAR 100 UNIT/ML Solostar Pen Inject 25 Units into the skin at bedtime. 06/11/19  Yes [provider]  omeprazole (PRILOSEC) 40 MG capsule TAKE 1 CAPSULE (40 MG TOTAL) BY MOUTH 2 (TWO) TIMES DAILY BEFORE A MEAL. 12/23/20  Yes Lin Landsman, MD    Allergies as of 12/30/2020  . (No Known Allergies)    History reviewed. No pertinent family history.  Social History   Socioeconomic History  . Marital status: Single    Spouse name: Not on file  . Number of children: Not on file  . Years of education: Not on file  . Highest education level: Not on file  Occupational History  . Not on file  Tobacco Use  . Smoking status: Former Smoker    Packs/day: 1.00    Years: 4.00    Pack years: 4.00    Types: Cigarettes  . Smokeless tobacco: Never Used  Vaping Use  . Vaping Use: Never used  Substance and Sexual Activity  . Alcohol use: Yes    Comment: 1-2 liqour and beer most days  . Drug use: Never  . Sexual activity: Yes  Other Topics Concern  . Not on file  Social History Narrative  . Not on file   Social Determinants of Health   Financial Resource Strain: Not on file  Food Insecurity: Not on file  Transportation Needs: Not on file  Physical Activity: Not on file  Stress:  Not on file  Social Connections: Not on file  Intimate Partner Violence: Not on file    Review of Systems: See HPI, otherwise negative ROS  Physical Exam: BP (!) 151/81   Pulse 90   Temp 97.7 F (36.5 C) (Temporal)   Resp 20   Ht _0  (1.727 m)   Wt 77.1 kg   SpO2 (!) 86%   BMI 25.85 kg/m  General:   Alert,  pleasant and cooperative in NAD Head:  Normocephalic and atraumatic. Neck:  Supple; no masses or thyromegaly. Lungs:  Clear throughout to auscultation.    Heart:  Regular rate and rhythm. Abdomen:  Soft, nontender and  nondistended. Normal bowel sounds, without guarding, and without rebound.   Neurologic:  Alert and  oriented x4;  grossly normal neurologically.  Impression/Plan: Lucas Shaw is here for an endoscopy to be performed for EoE  Risks, benefits, limitations, and alternatives regarding  endoscopy have been reviewed with the patient.  Questions have been answered.  All parties agreeable.   Sherri Sear, MD  03/03/2021, 8:07 AM

## 2021-03-03 NOTE — Anesthesia Postprocedure Evaluation (Signed)
Anesthesia Post Note  Patient: Lucas Shaw  Procedure(s) Performed: ESOPHAGOGASTRODUODENOSCOPY (EGD) WITH PROPOFOL (N/A )  Patient location during evaluation: Endoscopy Anesthesia Type: General Level of consciousness: awake and alert Pain management: pain level controlled Vital Signs Assessment: post-procedure vital signs reviewed and stable Respiratory status: spontaneous breathing, nonlabored ventilation, respiratory function stable and patient connected to nasal cannula oxygen Cardiovascular status: blood pressure returned to baseline and stable Postop Assessment: no apparent nausea or vomiting Anesthetic complications: no   No complications documented.   Last Vitals:  Vitals:   03/03/21 0837 03/03/21 0847  BP: (!) 141/86 129/84  Pulse: 86 82  Resp: 14 10  Temp:    SpO2: 98% 98%    Last Pain:  Vitals:   03/03/21 0847  TempSrc:   PainSc: 0-No pain                 Lenard Simmer

## 2021-03-04 ENCOUNTER — Encounter: Payer: Self-pay | Admitting: Gastroenterology

## 2021-03-04 LAB — SURGICAL PATHOLOGY

## 2021-04-01 ENCOUNTER — Other Ambulatory Visit: Payer: Self-pay | Admitting: Gastroenterology

## 2021-04-01 NOTE — Telephone Encounter (Signed)
Last office visit 12/30/2020  Last refill 12/23/2020 Was supposed to follow up in 2 months  Sent mychart message

## 2021-04-25 ENCOUNTER — Other Ambulatory Visit: Payer: Self-pay | Admitting: Gastroenterology

## 2021-05-09 ENCOUNTER — Other Ambulatory Visit: Payer: Self-pay | Admitting: Gastroenterology

## 2021-08-18 ENCOUNTER — Other Ambulatory Visit: Payer: Self-pay | Admitting: Gastroenterology

## 2021-08-18 DIAGNOSIS — K200 Eosinophilic esophagitis: Secondary | ICD-10-CM

## 2021-08-18 NOTE — Telephone Encounter (Signed)
Last office visit 12/30/2020 Last refill 12/30/2020 2 refills  No appointment Is scheduled

## 2021-12-10 IMAGING — CR DG NECK SOFT TISSUE
1 series · 2 of 2 positions shown · non-contrast
Comparison: None

CLINICAL DATA: Feels like something is stuck in throat after eating
peanut Ariana and drinking Mountain Dew this morning

EXAM:
NECK SOFT TISSUES - 1+ VIEW

[Series 1: dg neck soft tissue · 0.14mm/px · 2 of 2 slices shown]
[im 1/2]
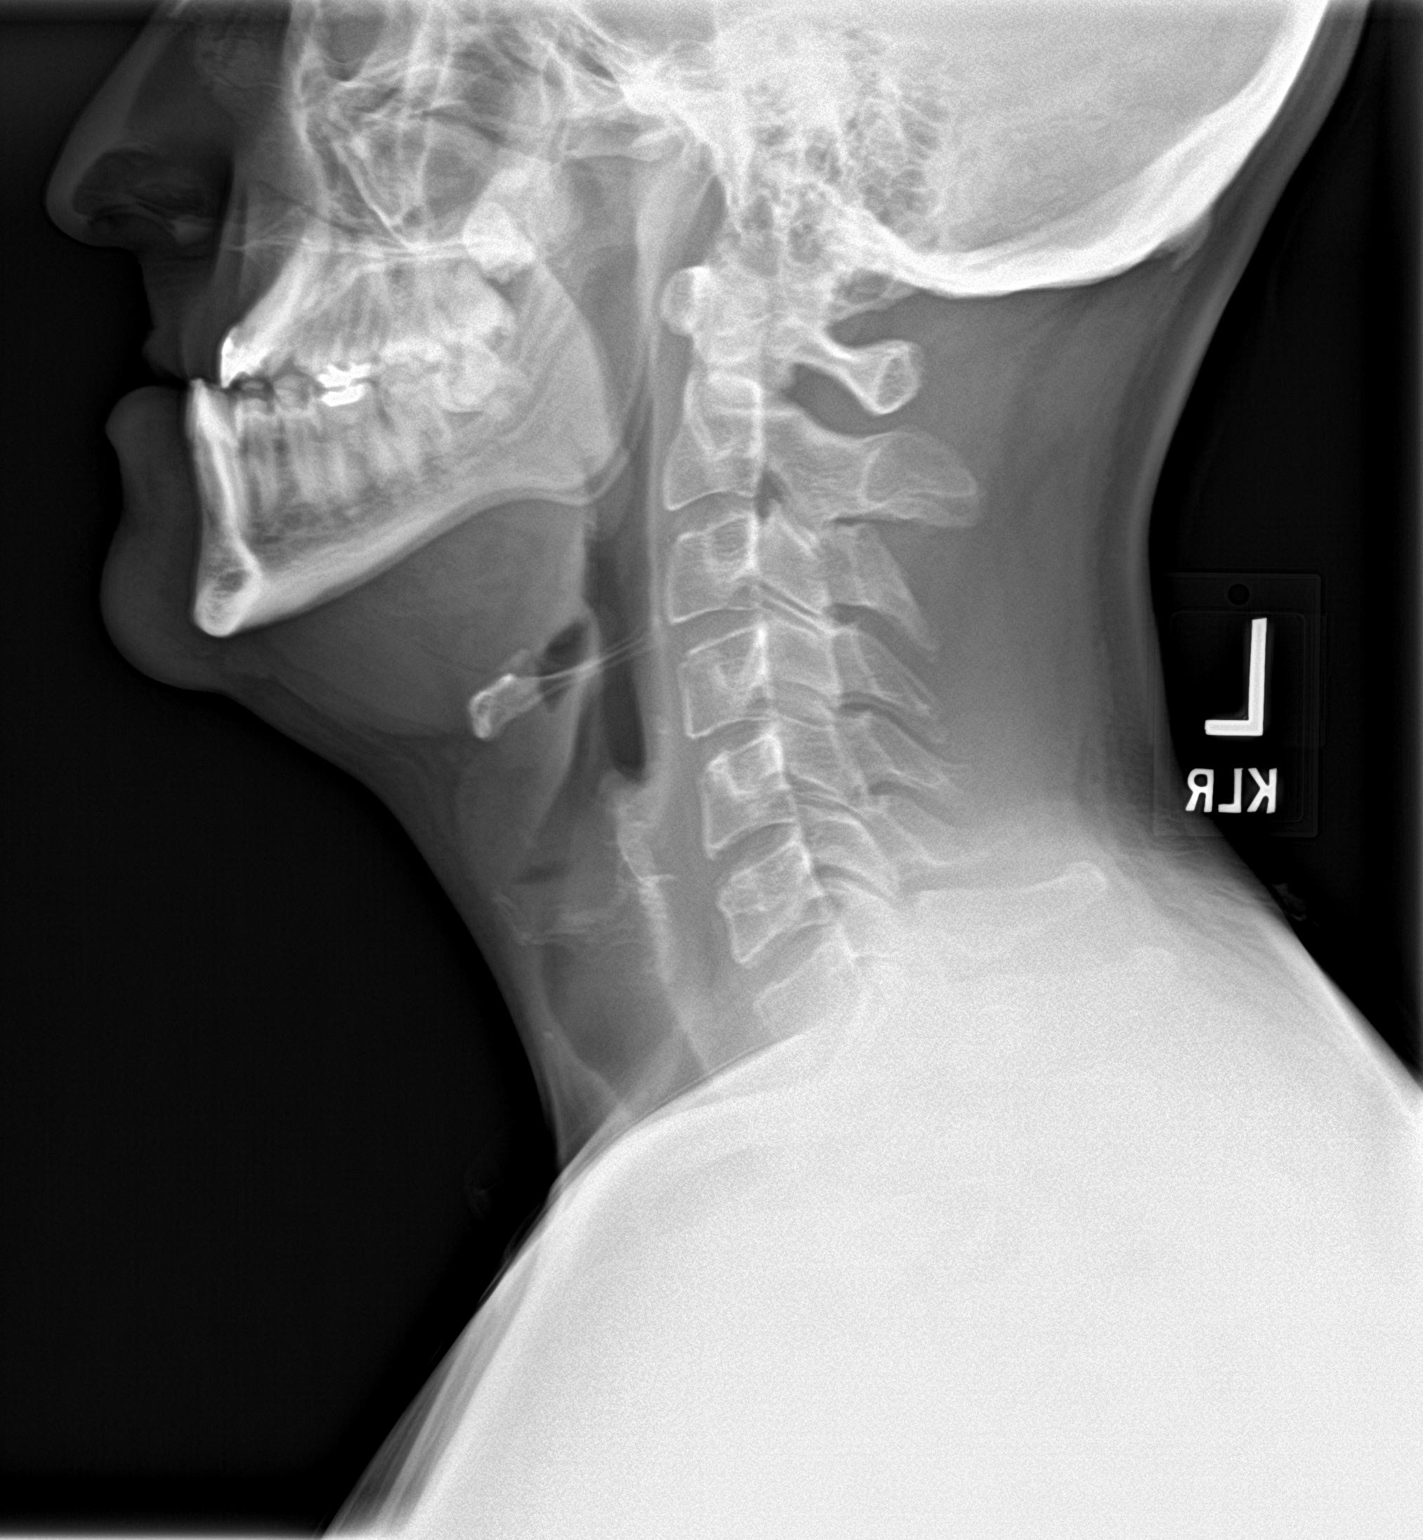
[im 2/2]
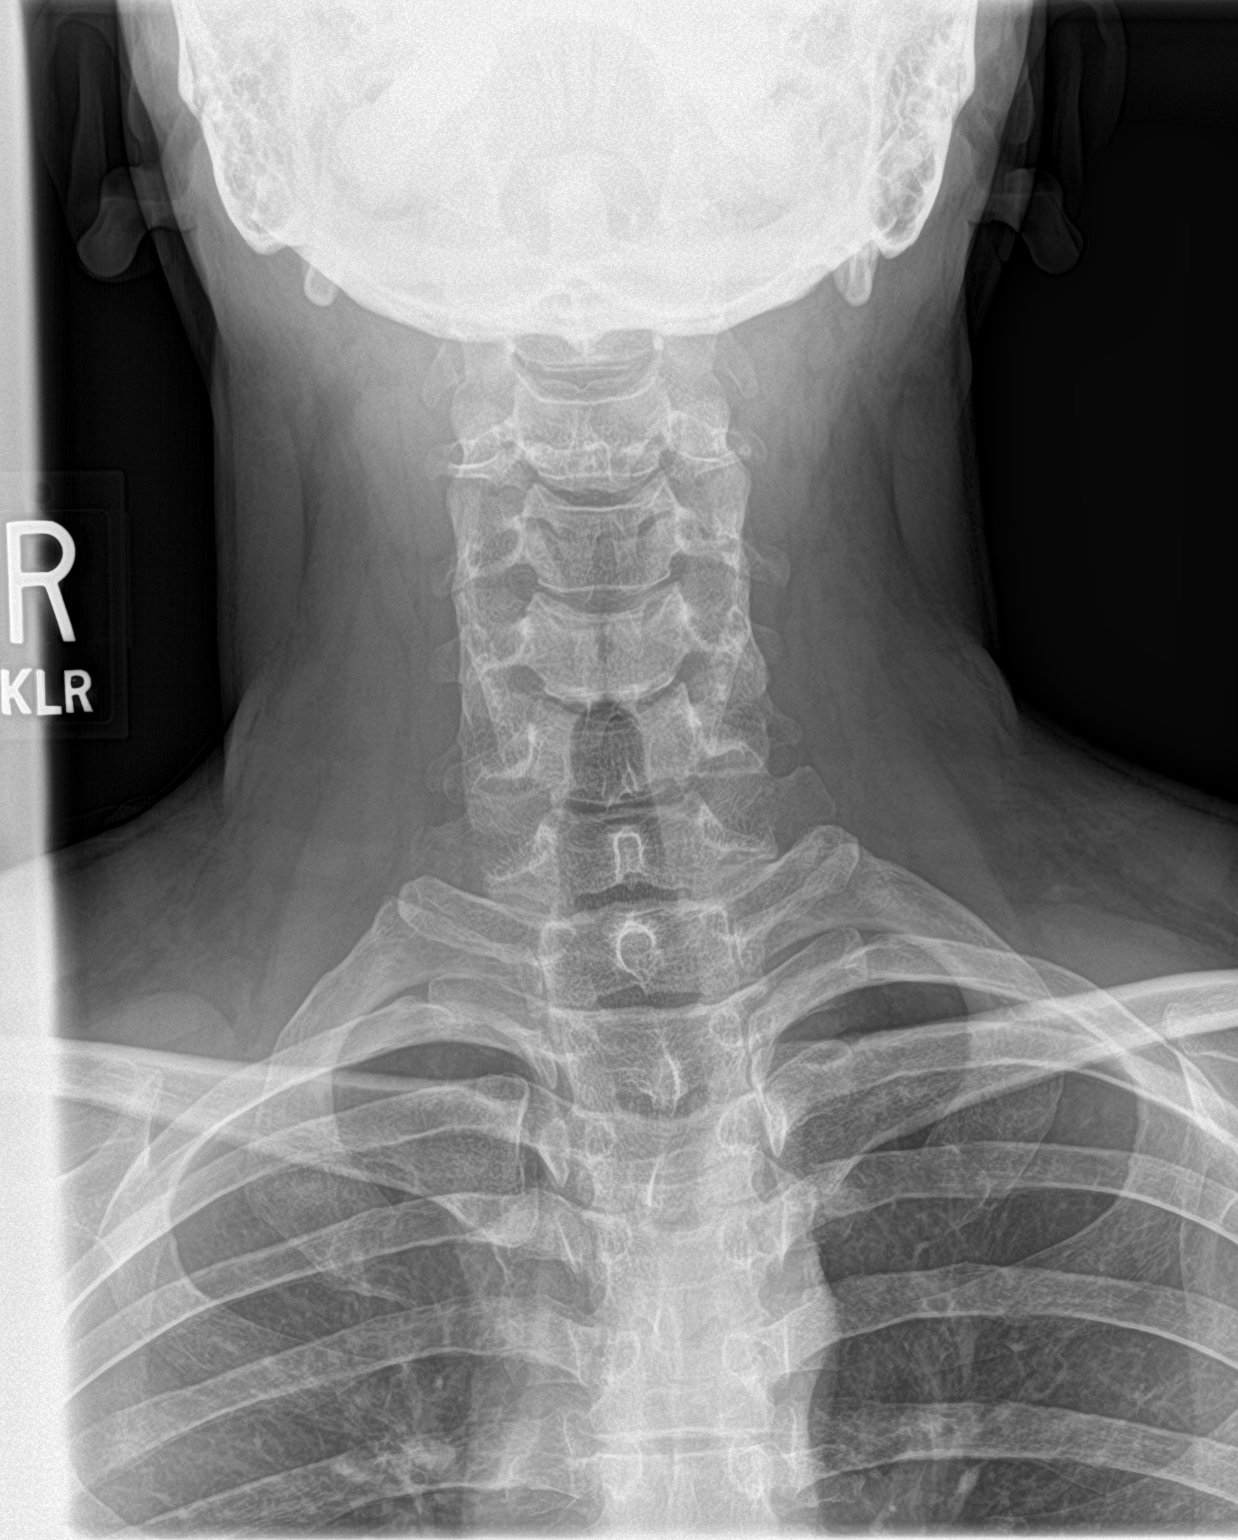

[2 of 2 positions shown; findings below may reference images not displayed]

FINDINGS: Prevertebral soft tissues normal thickness.

Epiglottis and aryepiglottic folds normal appearance.

Airway patent.

No radiopaque foreign bodies identified.
IMPRESSION: No acute abnormalities.
